# Patient Record
Sex: Female | Born: 1935 | Race: White | Hispanic: No | Marital: Married | Smoking: Former smoker
Health system: Southern US, Community
[De-identification: ages and names within clinical notes are randomized; demographics above are authoritative.]

## PROBLEM LIST (undated history)

## (undated) DIAGNOSIS — E039 Hypothyroidism, unspecified: Secondary | ICD-10-CM

## (undated) HISTORY — PX: THYROIDECTOMY: SHX17

## (undated) HISTORY — PX: CHOLECYSTECTOMY: SHX55

## (undated) HISTORY — PX: TONSILLECTOMY: SUR1361

---

## 2009-07-23 ENCOUNTER — Ambulatory Visit: Payer: Self-pay

## 2013-01-12 ENCOUNTER — Encounter (INDEPENDENT_AMBULATORY_CARE_PROVIDER_SITE_OTHER): Payer: Self-pay | Admitting: Ophthalmology

## 2013-02-08 ENCOUNTER — Encounter (INDEPENDENT_AMBULATORY_CARE_PROVIDER_SITE_OTHER): Payer: Self-pay | Admitting: Ophthalmology

## 2016-08-16 ENCOUNTER — Encounter: Payer: Self-pay | Admitting: Emergency Medicine

## 2016-08-16 ENCOUNTER — Emergency Department: Payer: Medicare Other

## 2016-08-16 ENCOUNTER — Observation Stay
Admission: EM | Admit: 2016-08-16 | Discharge: 2016-08-18 | Disposition: A | Discharge status: Home Health | Payer: Medicare Other | Attending: Internal Medicine | Admitting: Internal Medicine

## 2016-08-16 DIAGNOSIS — J90 Pleural effusion, not elsewhere classified: Secondary | ICD-10-CM | POA: Diagnosis not present

## 2016-08-16 DIAGNOSIS — E890 Postprocedural hypothyroidism: Secondary | ICD-10-CM | POA: Insufficient documentation

## 2016-08-16 DIAGNOSIS — Z7982 Long term (current) use of aspirin: Secondary | ICD-10-CM | POA: Insufficient documentation

## 2016-08-16 DIAGNOSIS — Z79899 Other long term (current) drug therapy: Secondary | ICD-10-CM | POA: Diagnosis not present

## 2016-08-16 DIAGNOSIS — Z681 Body mass index (BMI) 19 or less, adult: Secondary | ICD-10-CM | POA: Diagnosis not present

## 2016-08-16 DIAGNOSIS — J189 Pneumonia, unspecified organism: Secondary | ICD-10-CM

## 2016-08-16 DIAGNOSIS — Z87891 Personal history of nicotine dependence: Secondary | ICD-10-CM | POA: Diagnosis not present

## 2016-08-16 DIAGNOSIS — R0602 Shortness of breath: Secondary | ICD-10-CM

## 2016-08-16 DIAGNOSIS — R627 Adult failure to thrive: Secondary | ICD-10-CM | POA: Diagnosis not present

## 2016-08-16 DIAGNOSIS — D649 Anemia, unspecified: Secondary | ICD-10-CM | POA: Insufficient documentation

## 2016-08-16 DIAGNOSIS — R42 Dizziness and giddiness: Secondary | ICD-10-CM | POA: Diagnosis present

## 2016-08-16 DIAGNOSIS — E860 Dehydration: Secondary | ICD-10-CM | POA: Insufficient documentation

## 2016-08-16 DIAGNOSIS — D75839 Thrombocytosis, unspecified: Secondary | ICD-10-CM

## 2016-08-16 DIAGNOSIS — E43 Unspecified severe protein-calorie malnutrition: Secondary | ICD-10-CM | POA: Diagnosis not present

## 2016-08-16 DIAGNOSIS — Z9889 Other specified postprocedural states: Secondary | ICD-10-CM

## 2016-08-16 DIAGNOSIS — R262 Difficulty in walking, not elsewhere classified: Secondary | ICD-10-CM

## 2016-08-16 DIAGNOSIS — R531 Weakness: Secondary | ICD-10-CM

## 2016-08-16 DIAGNOSIS — E871 Hypo-osmolality and hyponatremia: Secondary | ICD-10-CM | POA: Diagnosis not present

## 2016-08-16 DIAGNOSIS — R739 Hyperglycemia, unspecified: Secondary | ICD-10-CM

## 2016-08-16 DIAGNOSIS — R918 Other nonspecific abnormal finding of lung field: Secondary | ICD-10-CM | POA: Diagnosis not present

## 2016-08-16 DIAGNOSIS — D473 Essential (hemorrhagic) thrombocythemia: Secondary | ICD-10-CM

## 2016-08-16 DIAGNOSIS — R7303 Prediabetes: Secondary | ICD-10-CM | POA: Diagnosis not present

## 2016-08-16 HISTORY — DX: Hypothyroidism, unspecified: E03.9

## 2016-08-16 LAB — URINALYSIS COMPLETE WITH MICROSCOPIC (ARMC ONLY)
BILIRUBIN URINE: NEGATIVE
Bacteria, UA: NONE SEEN
GLUCOSE, UA: NEGATIVE mg/dL
Hgb urine dipstick: NEGATIVE
NITRITE: NEGATIVE
PROTEIN: NEGATIVE mg/dL
Specific Gravity, Urine: 1.008 (ref 1.005–1.030)
pH: 7 (ref 5.0–8.0)

## 2016-08-16 LAB — COMPREHENSIVE METABOLIC PANEL
ALK PHOS: 139 U/L — AB (ref 38–126)
ALT: 11 U/L — AB (ref 14–54)
AST: 18 U/L (ref 15–41)
Albumin: 3.1 g/dL — ABNORMAL LOW (ref 3.5–5.0)
Anion gap: 7 (ref 5–15)
BILIRUBIN TOTAL: 0.8 mg/dL (ref 0.3–1.2)
BUN: 11 mg/dL (ref 6–20)
CHLORIDE: 97 mmol/L — AB (ref 101–111)
CO2: 29 mmol/L (ref 22–32)
CREATININE: 0.77 mg/dL (ref 0.44–1.00)
Calcium: 11.2 mg/dL — ABNORMAL HIGH (ref 8.9–10.3)
GFR calc Af Amer: >60 mL/min (ref >60)
Glucose, Bld: 123 mg/dL — ABNORMAL HIGH (ref 65–99)
Potassium: 3.5 mmol/L (ref 3.5–5.1)
Sodium: 133 mmol/L — ABNORMAL LOW (ref 135–145)
Total Protein: 8.2 g/dL — ABNORMAL HIGH (ref 6.5–8.1)

## 2016-08-16 LAB — GLUCOSE, CAPILLARY: GLUCOSE-CAPILLARY: 120 mg/dL — AB (ref 65–99)

## 2016-08-16 LAB — CBC
HCT: 35.3 % (ref 35.0–47.0)
Hemoglobin: 12 g/dL (ref 12.0–16.0)
MCH: 26.9 pg (ref 26.0–34.0)
MCHC: 33.9 g/dL (ref 32.0–36.0)
MCV: 79.4 fL — ABNORMAL LOW (ref 80.0–100.0)
PLATELETS: 570 10*3/uL — AB (ref 150–440)
RBC: 4.45 MIL/uL (ref 3.80–5.20)
RDW: 14.1 % (ref 11.5–14.5)
WBC: 10.6 10*3/uL (ref 3.6–11.0)

## 2016-08-16 MED ORDER — INSULIN ASPART 100 UNIT/ML ~~LOC~~ SOLN
0.0000 [IU] | Freq: Three times a day (TID) | SUBCUTANEOUS | Status: DC
  Administered 2016-08-17: 1 [IU] via SUBCUTANEOUS
  Filled 2016-08-16: qty 1

## 2016-08-16 MED ORDER — ONDANSETRON HCL 4 MG/2ML IJ SOLN
4.0000 mg | Freq: Four times a day (QID) | INTRAMUSCULAR | Status: DC | PRN
  Administered 2016-08-16 – 2016-08-17 (×3): 4 mg via INTRAVENOUS
  Filled 2016-08-16 (×2): qty 2

## 2016-08-16 MED ORDER — SODIUM CHLORIDE 0.9 % IV BOLUS (SEPSIS)
1000.0000 mL | Freq: Once | INTRAVENOUS | Status: AC
  Administered 2016-08-16: 1000 mL via INTRAVENOUS

## 2016-08-16 MED ORDER — LEVOFLOXACIN IN D5W 250 MG/50ML IV SOLN
250.0000 mg | INTRAVENOUS | Status: AC
  Administered 2016-08-17: 250 mg via INTRAVENOUS
  Filled 2016-08-16: qty 50

## 2016-08-16 MED ORDER — ACETAMINOPHEN 325 MG PO TABS: 650.0000 mg | ORAL_TABLET | Freq: Four times a day (QID) | ORAL | Status: DC | PRN

## 2016-08-16 MED ORDER — TRAMADOL HCL 50 MG PO TABS: 50.0000 mg | ORAL_TABLET | Freq: Four times a day (QID) | ORAL | Status: DC | PRN

## 2016-08-16 MED ORDER — SENNOSIDES-DOCUSATE SODIUM 8.6-50 MG PO TABS: 1.0000 | ORAL_TABLET | Freq: Every evening | ORAL | Status: DC | PRN

## 2016-08-16 MED ORDER — LEVOFLOXACIN IN D5W 500 MG/100ML IV SOLN
500.0000 mg | Freq: Once | INTRAVENOUS | Status: AC
  Administered 2016-08-16: 500 mg via INTRAVENOUS
  Filled 2016-08-16: qty 100

## 2016-08-16 MED ORDER — ONDANSETRON HCL 4 MG PO TABS: 4.0000 mg | ORAL_TABLET | Freq: Four times a day (QID) | ORAL | Status: DC | PRN

## 2016-08-16 MED ORDER — ACETAMINOPHEN 650 MG RE SUPP: 650.0000 mg | Freq: Four times a day (QID) | RECTAL | Status: DC | PRN

## 2016-08-16 MED ORDER — ONDANSETRON HCL 4 MG/2ML IJ SOLN
INTRAMUSCULAR | Status: AC
  Administered 2016-08-16: 4 mg via INTRAVENOUS
  Filled 2016-08-16: qty 2

## 2016-08-16 MED ORDER — INSULIN ASPART 100 UNIT/ML ~~LOC~~ SOLN: 0.0000 [IU] | Freq: Every day | SUBCUTANEOUS | Status: DC

## 2016-08-16 MED ORDER — IOPAMIDOL (ISOVUE-300) INJECTION 61%
75.0000 mL | Freq: Once | INTRAVENOUS | Status: AC | PRN
  Administered 2016-08-16: 75 mL via INTRAVENOUS

## 2016-08-16 MED ORDER — ONDANSETRON HCL 4 MG/2ML IJ SOLN: 4.0000 mg | Freq: Once | INTRAMUSCULAR | Status: DC

## 2016-08-16 MED ORDER — SODIUM CHLORIDE 0.9% FLUSH
3.0000 mL | Freq: Two times a day (BID) | INTRAVENOUS | Status: DC
  Administered 2016-08-16: 3 mL via INTRAVENOUS

## 2016-08-16 MED ORDER — SODIUM CHLORIDE 0.9 % IV SOLN
INTRAVENOUS | Status: AC
  Administered 2016-08-16 – 2016-08-17 (×2): via INTRAVENOUS

## 2016-08-16 NOTE — Care Management Obs Status (Signed)
[place] NOTIFICATION   Patient Details  Name: Gabriela Jordan MRN: [id number] Date of Birth: Jan 20, 1936   Medicare Observation Status Notification Given:  Yes    Ival Bible, RN 08/16/2016, 7:07 PM

## 2016-08-16 NOTE — ED Triage Notes (Signed)
Pt presents to ED via [facility] from home. Per EMS pt c/o N/V and dizziness when moving for approx 1 week. EMS states pt vomits when moving from a laying to a sitting position or a sitting to standing position. EMS denies vomiting when they move her but states patient hasn't had anything to eat and also c/o dizziness when she moves. Pt presents to ED alert and oriented at this time, states has never been diagnosed with vertigo.

## 2016-08-16 NOTE — Progress Notes (Signed)
Pharmacy Antibiotic Note  Gabriela Jordan is a 80 y.o. female admitted on 08/16/2016 with pneumonia.  Pharmacy has been consulted for Levaquin dosing. Patient received Levaquin '500mg'$  IV x one in the ED.  Plan: Levaquin '250mg'$  IV q24h  Height: '5\' 2"'$  (157.5 cm) Weight: 92 lb (41.7 kg) IBW/kg (Calculated) : 50.1  Temp (24hrs), Avg:98.4 F (36.9 C), Min:97.8 F (36.6 C), Max:98.9 F (37.2 C)   Recent Labs Lab 08/16/16 1423  WBC 10.6  CREATININE 0.77    Estimated Creatinine Clearance: 38.2 mL/min (by C-G formula based on SCr of 0.77 mg/dL).    No Known Allergies  Antimicrobials this admission: Levaquin 11/26 >>   Dose adjustments this admission:  Microbiology results:  Thank you for allowing pharmacy to be a part of this patient's care.  Paulina Fusi, PharmD, BCPS 08/16/2016 9:03 PM

## 2016-08-16 NOTE — ED Notes (Signed)
This RN notified of patient c/o nausea.

## 2016-08-16 NOTE — ED Notes (Signed)
Pt's daughter left, requesting to know when patient was to be transferred to the floor.

## 2016-08-16 NOTE — H&P (Signed)
[facility] NAME: Gabriela Jordan    MR#:  [id number]  DATE OF BIRTH:  1936/02/05  DATE OF ADMISSION:  08/16/2016  PRIMARY CARE PHYSICIAN: No primary care provider on file.   REQUESTING/REFERRING PHYSICIAN: Merlyn Lot, MD  CHIEF COMPLAINT:   dizziness HISTORY OF PRESENT ILLNESS:  Gabriela Jordan  is a 80 y.o. female with a known history of hypothyroidism , borderline DMIs presenting to the ED with a chief complaint of dizziness but denies any loss of consciousness. Patient is reporting significant weight loss of 10 pounds in the last 10 days. Patient is also reporting decreased appetite nausea and vomiting for a week. Patient had a CT head is normal, patient had CT chest and abdomen which has revealed 11 x 10 x 7 cm left lung mass and loculated pleural effusion. Hospitalist team is called to admit the patient for possible postobstructive pneumonia. Daughter came from [place] for works as a Therapist, sports to visit her mom for holidays and she is with her  PAST MEDICAL HISTORY:   Past Medical History:  Diagnosis Date  . Hypothyroid    Borderline DM PAST SURGICAL HISTOIRY:   Past Surgical History:  Procedure Laterality Date  . CHOLECYSTECTOMY    . THYROIDECTOMY    . TONSILLECTOMY      SOCIAL HISTORY:   Social History  Substance Use Topics  . Smoking status: Former Research scientist (life sciences)  . Smokeless tobacco: Never Used  . Alcohol use No    FAMILY HISTORY:  No family history on file.  DRUG ALLERGIES:  No Known Allergies  REVIEW OF SYSTEMS:  CONSTITUTIONAL: No fever, fatigue or weakness. Reporting 10 pounds weight loss in 10 days , decreased appetite EYES: No blurred or double vision.  EARS, NOSE, AND THROAT: No tinnitus or ear pain.  RESPIRATORY: No cough, shortness of breath, wheezing or hemoptysis.  CARDIOVASCULAR: No chest pain, orthopnea, edema.  GASTROINTESTINAL: No nausea, vomiting, diarrhea or abdominal pain.  GENITOURINARY: No  dysuria, hematuria.  ENDOCRINE: No polyuria, nocturia,  HEMATOLOGY: No anemia, easy bruising or bleeding SKIN: No rash or lesion. MUSCULOSKELETAL: No joint pain or arthritis.   NEUROLOGIC: Patient is reporting dizziness No tingling, numbness, weakness.  PSYCHIATRY: No anxiety or depression.   MEDICATIONS AT HOME:   Prior to Admission medications   Not on File      VITAL SIGNS:  Blood pressure (!) 152/77, pulse 96, temperature 98.9 F (37.2 C), temperature source Oral, resp. rate 18, height '5\' 2"'$  (1.575 m), weight 41.7 kg (92 lb), SpO2 92 %.  PHYSICAL EXAMINATION:  GENERAL:  80 y.o.-year-old patient lying in the bed with no acute distress.  EYES: Pupils equal, round, reactive to light and accommodation. No scleral icterus. Extraocular muscles intact.  HEENT: Head atraumatic, normocephalic. Oropharynx and nasopharynx clear.  NECK:  Supple, no jugular venous distention. No thyroid enlargement, no tenderness.  LUNGS: Diminished breath sounds in the left lung, moderate breath sounds in the right , no wheezing, rales,rhonchi or crepitation. No use of accessory muscles of respiration.  CARDIOVASCULAR: S1, S2 normal. No murmurs, rubs, or gallops.  ABDOMEN: Soft, nontender, nondistended. Bowel sounds present. No organomegaly or mass.  EXTREMITIES: No pedal edema, cyanosis, or clubbing.  NEUROLOGIC: Cranial nerves II through XII are intact. Muscle strength 5/5 in all extremities. Sensation intact. Gait not checked.  PSYCHIATRIC: The patient is alert and oriented x 3.  SKIN: No obvious rash, lesion, or ulcer.   LABORATORY PANEL:   CBC  Recent Labs Lab 08/16/16 1423  WBC 10.6  HGB 12.0  HCT 35.3  PLT 570*   ------------------------------------------------------------------------------------------------------------------  Chemistries   Recent Labs Lab 08/16/16 1423  NA 133*  K 3.5  CL 97*  CO2 29  GLUCOSE 123*  BUN 11  CREATININE 0.77  CALCIUM 11.2*  AST 18  ALT 11*   ALKPHOS 139*  BILITOT 0.8   ------------------------------------------------------------------------------------------------------------------  Cardiac Enzymes No results for input(s): TROPONINI in the last 168 hours. ------------------------------------------------------------------------------------------------------------------  RADIOLOGY:  Dg Chest 2 View  Result Date: 08/16/2016 CLINICAL DATA:  Nausea and vomiting EXAM: CHEST  2 VIEW COMPARISON:  None. FINDINGS: Cardiac shadow is obscured by a large left-sided pleural effusion. Additionally there is increased density projecting superiorly at in the left upper lobe likely related to consolidation in the superior segment of the left lower lobe. The right lung is clear with the exception of a few calcified granulomas. No bony abnormality is noted. IMPRESSION: Large left pleural effusion and apparent consolidation likely involving the superior segment of the left lower lobe. CT may be helpful for further evaluation. Electronically Signed   By: Inez Catalina M.D.   On: 08/16/2016 14:46   Ct Head Wo Contrast  Result Date: 08/16/2016 CLINICAL DATA:  Dizziness EXAM: CT HEAD WITHOUT CONTRAST TECHNIQUE: Contiguous axial images were obtained from the base of the skull through the vertex without intravenous contrast. COMPARISON:  None. FINDINGS: Brain: No evidence of acute infarction, hemorrhage, hydrocephalus, extra-axial collection or mass lesion/mass effect. Diffuse atrophic changes are noted. Vascular: No hyperdense vessel or unexpected calcification. Skull: Normal. Negative for fracture or focal lesion. Sinuses/Orbits: No acute finding. Other: None. IMPRESSION: Atrophic changes without acute abnormality. Electronically Signed   By: Inez Catalina M.D.   On: 08/16/2016 14:47   Ct Chest W Contrast  Result Date: 08/16/2016 CLINICAL DATA:  Possible metastatic disease EXAM: CT CHEST, ABDOMEN, AND PELVIS WITH CONTRAST TECHNIQUE: Multidetector CT  imaging of the chest, abdomen and pelvis was performed following the standard protocol during bolus administration of intravenous contrast. CONTRAST:  61m ISOVUE-300 IOPAMIDOL (ISOVUE-300) INJECTION 61% COMPARISON:  None. FINDINGS: CT CHEST FINDINGS Cardiovascular: Heart size within normal limits. No pericardial effusion. Central pulmonary artery is unremarkable. Atherosclerotic calcifications of thoracic aorta. Mediastinum/Nodes, lungs and pleura: There is a multilobulated mass in left hilum left upper lobe with central necrotic center. The mass measures at least 10.2 by 6.2 cm. The mass is best visualized in coronal image 42 extends about 11.4 cm cranial caudally by 7.3 cm transverse dimension. There is complete obstruction of left upper lobe bronchus. Obstruction of lingular bronchus. There is loculated large left pleural effusion probable malignant. There is significant heterogeneous expansile lung parenchyma in apex and superior segment of the left upper lobe. Findings highly suspicious for alveolar tumor extension. Entrapped lung parenchyma with proteinaceous or hemorrhagic alveolar products cannot be excluded as is expansile appearance of the major fissure. Similar appearance is noted just inferior to the tumor in lingula coronal image 38. There is partial atelectasis in left lower lobe. Findings highly suspicious for malignancy. Correlation with PET scan fluid analysis in pulmonology consult is recommended. No definite pleural thickening or pleural enhancement. Musculoskeletal: No destructive bony lesions are noted. Mild degenerative changes mid thoracic spine. Sagittal view of the sternum is unremarkable. No destructive rib lesions. CT ABDOMEN PELVIS FINDINGS Hepatobiliary: Enhanced liver shows mild intrahepatic biliary ductal dilatation. At least 2 cysts are noted in right hepatic lobe anteriorly the largest measures 8 mm. The patient is status  postcholecystectomy. CBD measures 6 mm in diameter. Pancreas:  Enhanced pancreas shows no focal mass or acute pancreatitis. Spleen: Enhanced spleen is normal. Adrenals/Urinary Tract: There is mild nodular thickening of right adrenal gland measures 8 mm. Mild thickening of left adrenal gland measures 9 mm. Metastatic disease cannot be excluded. Bilateral kidneys shows normal enhancement. No hydronephrosis or hydroureter. Delayed renal images shows bilateral renal symmetrical excretion. Stomach/Bowel: No small bowel obstruction. No pericecal inflammation. The terminal ileum is unremarkable. Normal appendix partially visualized in axial image 86. Colonic diverticula are noted sigmoid colon. There is redundant sigmoid colon. No evidence of acute diverticulitis. No evidence of acute colitis. Vascular/Lymphatic: Atherosclerotic calcifications of abdominal aorta and iliac arteries. No aortic aneurysm. No adenopathy. Reproductive: The uterus is not identified. No adnexal mass. There is mild thickening of vulvar wall. Clinical correlation is necessary. Other: No ascites or free abdominal air. The urinary bladder is under distended grossly unremarkable. Musculoskeletal: There are degenerative changes lumbar spine. Disc space flattening at L2-L3 level with vacuum disc phenomenon. Degenerative changes pubic symphysis. No destructive bony lesions are noted. There is dextroscoliosis of thoracic spine. IMPRESSION: 1. There is a dominant mass in left hilum left upper lobe extending in lingula. The mass measures at least 11.4 x 10.2 by 7.3 cm. There is complete obstruction of the bronchus in left upper lobe and lingula. There is low-density central necrotic center. This is highly suspicious for malignancy such as bronchogenic carcinoma. Further evaluation with PET scan fluid analysis and pulmonology consult is recommended. No definite evidence of pleural enhancement to suggest pleural metastatic disease. No significant mediastinal adenopathy 2. There is expansile heterogeneous appearance of  lung parenchyma in superior segment of left upper lobe and left apex. Findings suspicious for alveolar tumor spread or trapped lung with hemorrhagic or proteinaceous alveolar products. 3. There is loculated left pleural effusion. Partial atelectasis in left lower lobe. 4. The right lung is clear.  No pulmonary edema. 5. Mild bilateral nodular thickening of adrenal glands. Metastatic disease cannot be excluded. 6. No evidence of hepatic metastatic disease. Status postcholecystectomy. 7. No hydronephrosis or hydroureter. 8. No small bowel obstruction. 9. Colonic diverticula are noted left colon and sigmoid colon. No evidence of acute diverticulitis. 10. Degenerative changes lumbar spine. Dextroscoliosis of thoracic spine. 11. The uterus is not identified probable surgically absent. There is thickening of the wall of the vulva. Clinical correlation is necessary. Electronically Signed   By: Lahoma Crocker M.D.   On: 08/16/2016 17:24   Ct Abdomen Pelvis W Contrast  Result Date: 08/16/2016 CLINICAL DATA:  Possible metastatic disease EXAM: CT CHEST, ABDOMEN, AND PELVIS WITH CONTRAST TECHNIQUE: Multidetector CT imaging of the chest, abdomen and pelvis was performed following the standard protocol during bolus administration of intravenous contrast. CONTRAST:  38m ISOVUE-300 IOPAMIDOL (ISOVUE-300) INJECTION 61% COMPARISON:  None. FINDINGS: CT CHEST FINDINGS Cardiovascular: Heart size within normal limits. No pericardial effusion. Central pulmonary artery is unremarkable. Atherosclerotic calcifications of thoracic aorta. Mediastinum/Nodes, lungs and pleura: There is a multilobulated mass in left hilum left upper lobe with central necrotic center. The mass measures at least 10.2 by 6.2 cm. The mass is best visualized in coronal image 42 extends about 11.4 cm cranial caudally by 7.3 cm transverse dimension. There is complete obstruction of left upper lobe bronchus. Obstruction of lingular bronchus. There is loculated large  left pleural effusion probable malignant. There is significant heterogeneous expansile lung parenchyma in apex and superior segment of the left upper lobe. Findings highly suspicious for alveolar tumor  extension. Entrapped lung parenchyma with proteinaceous or hemorrhagic alveolar products cannot be excluded as is expansile appearance of the major fissure. Similar appearance is noted just inferior to the tumor in lingula coronal image 38. There is partial atelectasis in left lower lobe. Findings highly suspicious for malignancy. Correlation with PET scan fluid analysis in pulmonology consult is recommended. No definite pleural thickening or pleural enhancement. Musculoskeletal: No destructive bony lesions are noted. Mild degenerative changes mid thoracic spine. Sagittal view of the sternum is unremarkable. No destructive rib lesions. CT ABDOMEN PELVIS FINDINGS Hepatobiliary: Enhanced liver shows mild intrahepatic biliary ductal dilatation. At least 2 cysts are noted in right hepatic lobe anteriorly the largest measures 8 mm. The patient is status postcholecystectomy. CBD measures 6 mm in diameter. Pancreas: Enhanced pancreas shows no focal mass or acute pancreatitis. Spleen: Enhanced spleen is normal. Adrenals/Urinary Tract: There is mild nodular thickening of right adrenal gland measures 8 mm. Mild thickening of left adrenal gland measures 9 mm. Metastatic disease cannot be excluded. Bilateral kidneys shows normal enhancement. No hydronephrosis or hydroureter. Delayed renal images shows bilateral renal symmetrical excretion. Stomach/Bowel: No small bowel obstruction. No pericecal inflammation. The terminal ileum is unremarkable. Normal appendix partially visualized in axial image 86. Colonic diverticula are noted sigmoid colon. There is redundant sigmoid colon. No evidence of acute diverticulitis. No evidence of acute colitis. Vascular/Lymphatic: Atherosclerotic calcifications of abdominal aorta and iliac arteries.  No aortic aneurysm. No adenopathy. Reproductive: The uterus is not identified. No adnexal mass. There is mild thickening of vulvar wall. Clinical correlation is necessary. Other: No ascites or free abdominal air. The urinary bladder is under distended grossly unremarkable. Musculoskeletal: There are degenerative changes lumbar spine. Disc space flattening at L2-L3 level with vacuum disc phenomenon. Degenerative changes pubic symphysis. No destructive bony lesions are noted. There is dextroscoliosis of thoracic spine. IMPRESSION: 1. There is a dominant mass in left hilum left upper lobe extending in lingula. The mass measures at least 11.4 x 10.2 by 7.3 cm. There is complete obstruction of the bronchus in left upper lobe and lingula. There is low-density central necrotic center. This is highly suspicious for malignancy such as bronchogenic carcinoma. Further evaluation with PET scan fluid analysis and pulmonology consult is recommended. No definite evidence of pleural enhancement to suggest pleural metastatic disease. No significant mediastinal adenopathy 2. There is expansile heterogeneous appearance of lung parenchyma in superior segment of left upper lobe and left apex. Findings suspicious for alveolar tumor spread or trapped lung with hemorrhagic or proteinaceous alveolar products. 3. There is loculated left pleural effusion. Partial atelectasis in left lower lobe. 4. The right lung is clear.  No pulmonary edema. 5. Mild bilateral nodular thickening of adrenal glands. Metastatic disease cannot be excluded. 6. No evidence of hepatic metastatic disease. Status postcholecystectomy. 7. No hydronephrosis or hydroureter. 8. No small bowel obstruction. 9. Colonic diverticula are noted left colon and sigmoid colon. No evidence of acute diverticulitis. 10. Degenerative changes lumbar spine. Dextroscoliosis of thoracic spine. 11. The uterus is not identified probable surgically absent. There is thickening of the wall of  the vulva. Clinical correlation is necessary. Electronically Signed   By: Lahoma Crocker M.D.   On: 08/16/2016 17:24    EKG:   Orders placed or performed during the hospital encounter of 08/16/16  . ED EKG  . ED EKG  . EKG 12-Lead  . EKG 12-Lead    IMPRESSION AND PLAN:   Nayana Lenig  is a 80 y.o. female with a known history  of hypothyroidism , borderline DMIs presenting to the ED with a chief complaint of dizziness but denies any loss of consciousness. Patient is reporting significant weight loss of 10 pounds in the last 10 days. Patient is also reporting decreased appetite nausea and vomiting for a week. Patient had a CT chest and abdomen which has revealed 11 x 10 x 7 cm left lung mass and loculated pleural effusion  #Loculated pleural effusion with left lung mass with possible underlying postobstructive pneumonia Reporting intermittent episodes of shortness of breath Admit to MedSurg unit Consult interventional radiology for thoracentesis Holding DVT prophylaxis with Lovenox Consults pulmonology for bronchitis Consult is placed him oncology for lung mass Patient is started on levofloxacin  #UTI with abnormal urinalysis Patient is started on levofloxacin and urine culture is pending at this time  #Dizziness could be from decreased by mouth intake with decreased appetite Check orthostatics Monitor patient on telemetry Provide hydration with IV fluids PT evaluation  #Hyperthyroid and continue Synthroid and check TSH level  #7 history of borderline diet as well as to chemotherapy given A1c and start the patient on sliding scale insulin  DVT prophylaxis with SCDs, Start patient on Lovenox after the procedure   All the records are reviewed and case discussed with ED provider. Management plans discussed with the patient, daughter and son-in-law and they are in agreement.  CODE STATUS: Full code, daughter is the healthcare power of attorney  TOTAL TIME TAKING CARE OF THIS PATIENT:  43 minutes.   Note: This dictation was prepared with Dragon dictation along with smaller phrase technology. Any transcriptional errors that result from this process are unintentional.  Nicholes Mango M.D on 08/16/2016 at 6:07 PM  Between 7am to 6pm - Pager - [phone]  After 6pm go to [email or web address] - password [email or web address] [facility]  [facility]  Office  [phone]  CC: Primary care physician; No primary care provider on file.

## 2016-08-16 NOTE — ED Notes (Signed)
Report given to Ashley, RN

## 2016-08-16 NOTE — ED Provider Notes (Signed)
[facility] Emergency Department Provider Note    First MD Initiated Contact with Patient 08/16/16 1336     (approximate)  I have reviewed the triage vital signs and the nursing notes.   HISTORY  Chief Complaint Nausea and Near Syncope    HPI Gabriela Jordan is a 80 y.o. female  who presents from home via EMS due to complaint of nausea vomitus and lightheadedness over the past one week. Patient states that she gets very lightheaded when sitting up from laying position or standing from a sitting position. Patient states that her appetite has decreased significantly over the past 2 weeks. She admits to significant weight loss. Denies any fevers. No chest pain. No numbness or tingling. Denies any headache.   Past Medical History:  Diagnosis Date  . Hypothyroid    No family history on file. Past Surgical History:  Procedure Laterality Date  . CHOLECYSTECTOMY    . THYROIDECTOMY    . TONSILLECTOMY     There are no active problems to display for this patient.     Prior to Admission medications   Not on File    Allergies Patient has no known allergies.    Social History Social History  Substance Use Topics  . Smoking status: Former Research scientist (life sciences)  . Smokeless tobacco: Never Used  . Alcohol use No    Review of Systems Patient denies headaches, rhinorrhea, blurry vision, numbness, shortness of breath, chest pain, edema, cough, abdominal pain, nausea, vomiting, diarrhea, dysuria, fevers, rashes or hallucinations unless otherwise stated above in HPI. ____________________________________________   PHYSICAL EXAM:  VITAL SIGNS: Vitals:   08/16/16 1350  BP: (!) 152/77  Pulse: 96  Resp: 18  Temp: 98.9 F (37.2 C)    Constitutional: Alert and oriented. Well appearing and in no acute distress. Eyes: Conjunctivae are normal. PERRL. EOMI. Head: Atraumatic. Nose: No congestion/rhinnorhea. Mouth/Throat: Mucous membranes are moist.  Oropharynx  non-erythematous. Neck: No stridor. Painless ROM. No cervical spine tenderness to palpation Hematological/Lymphatic/Immunilogical: No cervical lymphadenopathy. Cardiovascular: Normal rate, regular rhythm. Grossly normal heart sounds.  Good peripheral circulation. Respiratory: Normal respiratory effort.  No retractions. Lungs CTAB. Gastrointestinal: Soft and nontender. No distention. No abdominal bruits. No CVA tenderness. Genitourinary:  Musculoskeletal: No lower extremity tenderness nor edema.  No joint effusions. Neurologic:  Normal speech and language. No gross focal neurologic deficits are appreciated. No gait instability. Skin:  Skin is warm, dry and intact. No rash noted.   ____________________________________________   LABS (all labs ordered are listed, but only abnormal results are displayed)  Results for orders placed or performed during the hospital encounter of 08/16/16 (from the past 24 hour(s))  CBC     Status: Abnormal   Collection Time: 08/16/16  2:23 PM  Result Value Ref Range   WBC 10.6 3.6 - 11.0 K/uL   RBC 4.45 3.80 - 5.20 MIL/uL   Hemoglobin 12.0 12.0 - 16.0 g/dL   HCT 35.3 35.0 - 47.0 %   MCV 79.4 (L) 80.0 - 100.0 fL   MCH 26.9 26.0 - 34.0 pg   MCHC 33.9 32.0 - 36.0 g/dL   RDW 14.1 11.5 - 14.5 %   Platelets 570 (H) 150 - 440 K/uL  Comprehensive metabolic panel     Status: Abnormal   Collection Time: 08/16/16  2:23 PM  Result Value Ref Range   Sodium 133 (L) 135 - 145 mmol/L   Potassium 3.5 3.5 - 5.1 mmol/L   Chloride 97 (L) 101 - 111 mmol/L  CO2 29 22 - 32 mmol/L   Glucose, Bld 123 (H) 65 - 99 mg/dL   BUN 11 6 - 20 mg/dL   Creatinine, Ser 0.77 0.44 - 1.00 mg/dL   Calcium 11.2 (H) 8.9 - 10.3 mg/dL   Total Protein 8.2 (H) 6.5 - 8.1 g/dL   Albumin 3.1 (L) 3.5 - 5.0 g/dL   AST 18 15 - 41 U/L   ALT 11 (L) 14 - 54 U/L   Alkaline Phosphatase 139 (H) 38 - 126 U/L   Total Bilirubin 0.8 0.3 - 1.2 mg/dL   GFR calc non Af Amer >60 >60 mL/min   GFR calc Af  Amer >60 >60 mL/min   Anion gap 7 5 - 15  Urinalysis complete, with microscopic     Status: Abnormal   Collection Time: 08/16/16  4:12 PM  Result Value Ref Range   Color, Urine YELLOW (A) YELLOW   APPearance CLOUDY (A) CLEAR   Glucose, UA NEGATIVE NEGATIVE mg/dL   Bilirubin Urine NEGATIVE NEGATIVE   Ketones, ur TRACE (A) NEGATIVE mg/dL   Specific Gravity, Urine 1.008 1.005 - 1.030   Hgb urine dipstick NEGATIVE NEGATIVE   pH 7.0 5.0 - 8.0   Protein, ur NEGATIVE NEGATIVE mg/dL   Nitrite NEGATIVE NEGATIVE   Leukocytes, UA 3+ (A) NEGATIVE   RBC / HPF 0-5 0 - 5 RBC/hpf   WBC, UA 6-30 0 - 5 WBC/hpf   Bacteria, UA NONE SEEN NONE SEEN   Squamous Epithelial / LPF 0-5 (A) NONE SEEN   Mucous PRESENT    Amorphous Crystal PRESENT    ____________________________________________  EKG My review and personal interpretation at Time: 14:13   Indication: dizziness  Rate: 90  Rhythm: sinus Axis: normal Other: non specific st changes, normal intervals., ____________________________________________  RADIOLOGY  I personally reviewed all radiographic images ordered to evaluate for the above acute complaints and reviewed radiology reports and findings.  These findings were personally discussed with the patient.  Please see medical record for radiology report.  ____________________________________________   PROCEDURES  Procedure(s) performed: none Procedures    Critical Care performed: no ____________________________________________   INITIAL IMPRESSION / ASSESSMENT AND PLAN / ED COURSE  Pertinent labs & imaging results that were available during my care of the patient were reviewed by me and considered in my medical decision making (see chart for details).  DDX: dehydration, electrolyte ab, uti, pna, chf, malignancy  Gabriela Jordan is a 80 y.o. who presents to the ED with Complaint of nausea vomiting dizziness over the past 1 week. Patient appears markedly dehydrated and cachectic. My  initial exam is more concerning for severe protein calorie malnutrition likely secondary to probable underlying malignancy. No evidence of sepsis. No evidence of neuro deficit. CT imaging will be ordered to evaluate for malignancy versus CVA. Her exam is less consistent with BPPV. We'll provide IV fluids for dehydration. Will order laboratory evaluation due to concern for I light abnormality.  The patient will be placed on continuous pulse oximetry and telemetry for monitoring.  Laboratory evaluation will be sent to evaluate for the above complaints.     Clinical Course as of Aug 17 1707  Nancy Fetter Aug 16, 2016  1704 Had extensive conversation with patient and family at bedside regarding her goals of care. Currently wishes to be full code. Spoke with interventional radiology in [place] regarding appropriate workup and timing this patient will need both bronchoscopy and diagnostic paracentesis.  He recommended the patient be admitted to the hospital  for coordinated thoracentesis and bronchoscopy.  I spoke with Dr. Margaretmary Eddy regarding admission for probable post obstructive pna.  Have discussed with the patient and available family all diagnostics and treatments performed thus far and all questions were answered to the best of my ability. The patient demonstrates understanding and agreement with plan.   [PR]    Clinical Course User Index [PR] Merlyn Lot, MD     ____________________________________________   FINAL CLINICAL IMPRESSION(S) / ED DIAGNOSES  Final diagnoses:  Dizziness  Weakness  Pleural effusion  Pneumonia of left lung due to infectious organism, unspecified part of lung  Dehydration      NEW MEDICATIONS STARTED DURING THIS VISIT:  New Prescriptions   No medications on file     Note:  This document was prepared using Dragon voice recognition software and may include unintentional dictation errors.    Merlyn Lot, MD 08/16/16 [phone]

## 2016-08-16 NOTE — ED Notes (Signed)
Pt assisted to the bathroom by this RN. Pt then ambulated down the hall. Pt tolerated well. Pt O2 sats 95-100% at this time.

## 2016-08-16 NOTE — ED Notes (Addendum)
Pt states she feels better after receiving the Zofran. Pt taken to the floor by Lorriane Shire, RN.

## 2016-08-17 ENCOUNTER — Observation Stay
Admit: 2016-08-17 | Discharge: 2016-08-17 | Disposition: A | Discharge status: Home / Self Care | Payer: Medicare Other | Attending: Internal Medicine | Admitting: Internal Medicine

## 2016-08-17 ENCOUNTER — Observation Stay: Payer: Medicare Other

## 2016-08-17 DIAGNOSIS — R42 Dizziness and giddiness: Secondary | ICD-10-CM

## 2016-08-17 DIAGNOSIS — R918 Other nonspecific abnormal finding of lung field: Secondary | ICD-10-CM | POA: Diagnosis not present

## 2016-08-17 DIAGNOSIS — R112 Nausea with vomiting, unspecified: Secondary | ICD-10-CM

## 2016-08-17 DIAGNOSIS — E039 Hypothyroidism, unspecified: Secondary | ICD-10-CM

## 2016-08-17 DIAGNOSIS — R634 Abnormal weight loss: Secondary | ICD-10-CM

## 2016-08-17 DIAGNOSIS — Z79899 Other long term (current) drug therapy: Secondary | ICD-10-CM

## 2016-08-17 DIAGNOSIS — Z87891 Personal history of nicotine dependence: Secondary | ICD-10-CM

## 2016-08-17 LAB — COMPREHENSIVE METABOLIC PANEL
ALBUMIN: 2.7 g/dL — AB (ref 3.5–5.0)
ALT: 9 U/L — AB (ref 14–54)
AST: 12 U/L — AB (ref 15–41)
Alkaline Phosphatase: 109 U/L (ref 38–126)
Anion gap: 5 (ref 5–15)
BUN: 10 mg/dL (ref 6–20)
CHLORIDE: 105 mmol/L (ref 101–111)
CO2: 27 mmol/L (ref 22–32)
CREATININE: 0.66 mg/dL (ref 0.44–1.00)
Calcium: 10.4 mg/dL — ABNORMAL HIGH (ref 8.9–10.3)
GFR calc Af Amer: >60 mL/min (ref >60)
Glucose, Bld: 92 mg/dL (ref 65–99)
POTASSIUM: 3.5 mmol/L (ref 3.5–5.1)
SODIUM: 137 mmol/L (ref 135–145)
Total Bilirubin: 0.7 mg/dL (ref 0.3–1.2)
Total Protein: 6.9 g/dL (ref 6.5–8.1)

## 2016-08-17 LAB — CBC
HEMATOCRIT: 31.2 % — AB (ref 35.0–47.0)
Hemoglobin: 10.5 g/dL — ABNORMAL LOW (ref 12.0–16.0)
MCH: 26.3 pg (ref 26.0–34.0)
MCHC: 33.6 g/dL (ref 32.0–36.0)
MCV: 78.5 fL — AB (ref 80.0–100.0)
Platelets: 481 10*3/uL — ABNORMAL HIGH (ref 150–440)
RBC: 3.97 MIL/uL (ref 3.80–5.20)
RDW: 14.3 % (ref 11.5–14.5)
WBC: 8.3 10*3/uL (ref 3.6–11.0)

## 2016-08-17 LAB — GLUCOSE, CAPILLARY
GLUCOSE-CAPILLARY: 121 mg/dL — AB (ref 65–99)
Glucose-Capillary: 96 mg/dL (ref 65–99)
Glucose-Capillary: 108 mg/dL — ABNORMAL HIGH (ref 65–99)
Glucose-Capillary: 139 mg/dL — ABNORMAL HIGH (ref 65–99)

## 2016-08-17 LAB — BODY FLUID CELL COUNT WITH DIFFERENTIAL
EOS FL: 0 %
Lymphs, Fluid: 85 %
MONOCYTE-MACROPHAGE-SEROUS FLUID: 5 %
NEUTROPHIL FLUID: 10 %
OTHER CELLS FL: 0 %
Total Nucleated Cell Count, Fluid: 3150 cu mm

## 2016-08-17 LAB — PROTIME-INR
INR: 1.12
Prothrombin Time: 14.5 seconds (ref 11.4–15.2)

## 2016-08-17 LAB — PROTEIN, BODY FLUID: Total protein, fluid: 4.9 g/dL

## 2016-08-17 LAB — TSH: TSH: 1.217 u[IU]/mL (ref 0.350–4.500)

## 2016-08-17 LAB — LACTATE DEHYDROGENASE, PLEURAL OR PERITONEAL FLUID: LD FL: 180 U/L — AB (ref 3–23)

## 2016-08-17 LAB — GLUCOSE, SEROUS FLUID: Glucose, Fluid: 114 mg/dL

## 2016-08-17 MED ORDER — LEVOFLOXACIN IN D5W 750 MG/150ML IV SOLN
750.0000 mg | INTRAVENOUS | Status: DC
  Filled 2016-08-17: qty 150

## 2016-08-17 MED ORDER — PANTOPRAZOLE SODIUM 40 MG PO TBEC
40.0000 mg | DELAYED_RELEASE_TABLET | Freq: Two times a day (BID) | ORAL | Status: DC
  Administered 2016-08-17 – 2016-08-18 (×3): 40 mg via ORAL
  Filled 2016-08-17 (×3): qty 1

## 2016-08-17 MED ORDER — GLUCERNA SHAKE PO LIQD: 237.0000 mL | Freq: Three times a day (TID) | ORAL | Status: DC

## 2016-08-17 MED ORDER — ENSURE ENLIVE PO LIQD
237.0000 mL | Freq: Three times a day (TID) | ORAL | Status: DC
  Administered 2016-08-17 – 2016-08-18 (×3): 237 mL via ORAL

## 2016-08-17 MED ORDER — ASPIRIN EC 81 MG PO TBEC
81.0000 mg | DELAYED_RELEASE_TABLET | Freq: Every day | ORAL | Status: DC
  Administered 2016-08-18: 81 mg via ORAL
  Filled 2016-08-17: qty 1

## 2016-08-17 MED ORDER — SODIUM CHLORIDE 0.9 % IV SOLN
INTRAVENOUS | Status: DC
  Administered 2016-08-17 – 2016-08-18 (×2): via INTRAVENOUS

## 2016-08-17 MED ORDER — BOOST / RESOURCE BREEZE PO LIQD
1.0000 | Freq: Three times a day (TID) | ORAL | Status: DC
  Administered 2016-08-17: 1 via ORAL

## 2016-08-17 MED ORDER — ENOXAPARIN SODIUM 30 MG/0.3ML ~~LOC~~ SOLN
30.0000 mg | SUBCUTANEOUS | Status: DC
  Administered 2016-08-17: 30 mg via SUBCUTANEOUS
  Filled 2016-08-17: qty 0.3

## 2016-08-17 MED ORDER — MIRTAZAPINE 15 MG PO TABS
15.0000 mg | ORAL_TABLET | Freq: Every day | ORAL | Status: DC
  Administered 2016-08-17: 15 mg via ORAL
  Filled 2016-08-17: qty 1

## 2016-08-17 MED ORDER — LEVOTHYROXINE SODIUM 50 MCG PO TABS
50.0000 ug | ORAL_TABLET | Freq: Every day | ORAL | Status: DC
  Administered 2016-08-18: 50 ug via ORAL
  Filled 2016-08-17: qty 1

## 2016-08-17 MED ORDER — LORAZEPAM 2 MG/ML IJ SOLN: 0.5000 mg | Freq: Once | INTRAMUSCULAR | Status: DC

## 2016-08-17 MED ORDER — CALCITONIN (SALMON) 200 UNIT/ACT NA SOLN
1.0000 | Freq: Every day | NASAL | Status: DC
  Administered 2016-08-18: 1 via NASAL
  Filled 2016-08-17: qty 3.7

## 2016-08-17 NOTE — Progress Notes (Signed)
CT chest reviewed: large left lung mass with collapse with large left effusion I recommend US guided Thoracentesis for therapeutic and diagnostic assessment Cytology pending  Case with Discussed with Dr. Ponciano Ort, M.D.  [facility]  Medical Director [place] Director [facility] Cardio-Pulmonary Department

## 2016-08-17 NOTE — Progress Notes (Signed)
[facility] NAME: Gabriela Jordan    MR#:  [id number]  DATE OF BIRTH:  1936-03-20  SUBJECTIVE:  CHIEF COMPLAINT:   Chief Complaint  Patient presents with  . Nausea  . Near Syncope   The patient is a 80 year old female with medical history significant for history of hypothyroidism, borderline diabetes mellitus, who presents to the hospital with complaints of dizziness, weight loss of approximately 10 pounds over the past 10 days, decreased appetite, nausea and vomiting. Patient had CT of chest and abdomen done which revealed left lung mass and loculated pleural effusion. Patient was admitted for evaluation and treatment. Consultation with oncologist and pulmonologist was requested, pending. Thoracentesis was ordered for  Today, results are pending. Discussed with Dr. Mortimer Fries, pulmonologist, who recommended thoracentesis., But not bronchoscopy. The patient complains of some epigastric abdominal pain on palpation    Review of Systems  Constitutional: Positive for weight loss. Negative for chills and fever.  HENT: Negative for congestion.   Eyes: Negative for blurred vision and double vision.  Respiratory: Negative for cough, sputum production, shortness of breath and wheezing.   Cardiovascular: Negative for chest pain, palpitations, orthopnea, leg swelling and PND.  Gastrointestinal: Positive for abdominal pain and nausea. Negative for blood in stool, constipation, diarrhea and vomiting.  Genitourinary: Negative for dysuria, frequency, hematuria and urgency.  Musculoskeletal: Negative for falls.  Neurological: Negative for dizziness, tremors, focal weakness and headaches.  Endo/Heme/Allergies: Does not bruise/bleed easily.  Psychiatric/Behavioral: Negative for depression. The patient does not have insomnia.     VITAL SIGNS: Blood pressure 140/75, pulse 90, temperature 98 F (36.7 C), temperature source Oral, resp. rate 17, height '5\' 2"'$   (1.575 m), weight 41.7 kg (92 lb), SpO2 92 %.  PHYSICAL EXAMINATION:   GENERAL:  80 y.o.-year-old patient lying in the bed with no acute distress.  EYES: Pupils equal, round, reactive to light and accommodation. No scleral icterus. Extraocular muscles intact.  HEENT: Head atraumatic, normocephalic. Oropharynx and nasopharynx clear.  NECK:  Supple, no jugular venous distention. No thyroid enlargement, no tenderness.  LUNGS: Normal breath sounds bilaterally, no wheezing, rales,rhonchi or crepitation. No use of accessory muscles of respiration.  CARDIOVASCULAR: S1, S2 normal. No murmurs, rubs, or gallops.  ABDOMEN: Soft, tenderness in epigastric abdominal area, but no rebound or guarding, nondistended. Bowel sounds present. No organomegaly or mass.  EXTREMITIES: No pedal edema, cyanosis, or clubbing.  NEUROLOGIC: Cranial nerves II through XII are intact. Muscle strength 5/5 in all extremities. Sensation intact. Gait not checked.  PSYCHIATRIC: The patient is alert and oriented x 3.  SKIN: No obvious rash, lesion, or ulcer.   ORDERS/RESULTS REVIEWED:   CBC  Recent Labs Lab 08/16/16 1423 08/17/16 0516  WBC 10.6 8.3  HGB 12.0 10.5*  HCT 35.3 31.2*  PLT 570* 481*  MCV 79.4* 78.5*  MCH 26.9 26.3  MCHC 33.9 33.6  RDW 14.1 14.3   ------------------------------------------------------------------------------------------------------------------  Chemistries   Recent Labs Lab 08/16/16 1423 08/17/16 0516  NA 133* 137  K 3.5 3.5  CL 97* 105  CO2 29 27  GLUCOSE 123* 92  BUN 11 10  CREATININE 0.77 0.66  CALCIUM 11.2* 10.4*  AST 18 12*  ALT 11* 9*  ALKPHOS 139* 109  BILITOT 0.8 0.7   ------------------------------------------------------------------------------------------------------------------ estimated creatinine clearance is 38.2 mL/min (by C-G formula based on SCr of 0.66  mg/dL). ------------------------------------------------------------------------------------------------------------------  Recent Labs  08/17/16 0516  TSH 1.217    Cardiac Enzymes No results  for input(s): CKMB, TROPONINI, MYOGLOBIN in the last 168 hours.  Invalid input(s): CK ------------------------------------------------------------------------------------------------------------------ Invalid input(s): POCBNP ---------------------------------------------------------------------------------------------------------------  RADIOLOGY: Dg Chest 2 View  Result Date: 08/16/2016 CLINICAL DATA:  Nausea and vomiting EXAM: CHEST  2 VIEW COMPARISON:  None. FINDINGS: Cardiac shadow is obscured by a large left-sided pleural effusion. Additionally there is increased density projecting superiorly at in the left upper lobe likely related to consolidation in the superior segment of the left lower lobe. The right lung is clear with the exception of a few calcified granulomas. No bony abnormality is noted. IMPRESSION: Large left pleural effusion and apparent consolidation likely involving the superior segment of the left lower lobe. CT may be helpful for further evaluation. Electronically Signed   By: Inez Catalina M.D.   On: 08/16/2016 14:46   Ct Head Wo Contrast  Result Date: 08/16/2016 CLINICAL DATA:  Dizziness EXAM: CT HEAD WITHOUT CONTRAST TECHNIQUE: Contiguous axial images were obtained from the base of the skull through the vertex without intravenous contrast. COMPARISON:  None. FINDINGS: Brain: No evidence of acute infarction, hemorrhage, hydrocephalus, extra-axial collection or mass lesion/mass effect. Diffuse atrophic changes are noted. Vascular: No hyperdense vessel or unexpected calcification. Skull: Normal. Negative for fracture or focal lesion. Sinuses/Orbits: No acute finding. Other: None. IMPRESSION: Atrophic changes without acute abnormality. Electronically Signed   By: Inez Catalina M.D.    On: 08/16/2016 14:47   Ct Chest W Contrast  Result Date: 08/16/2016 CLINICAL DATA:  Possible metastatic disease EXAM: CT CHEST, ABDOMEN, AND PELVIS WITH CONTRAST TECHNIQUE: Multidetector CT imaging of the chest, abdomen and pelvis was performed following the standard protocol during bolus administration of intravenous contrast. CONTRAST:  48m ISOVUE-300 IOPAMIDOL (ISOVUE-300) INJECTION 61% COMPARISON:  None. FINDINGS: CT CHEST FINDINGS Cardiovascular: Heart size within normal limits. No pericardial effusion. Central pulmonary artery is unremarkable. Atherosclerotic calcifications of thoracic aorta. Mediastinum/Nodes, lungs and pleura: There is a multilobulated mass in left hilum left upper lobe with central necrotic center. The mass measures at least 10.2 by 6.2 cm. The mass is best visualized in coronal image 42 extends about 11.4 cm cranial caudally by 7.3 cm transverse dimension. There is complete obstruction of left upper lobe bronchus. Obstruction of lingular bronchus. There is loculated large left pleural effusion probable malignant. There is significant heterogeneous expansile lung parenchyma in apex and superior segment of the left upper lobe. Findings highly suspicious for alveolar tumor extension. Entrapped lung parenchyma with proteinaceous or hemorrhagic alveolar products cannot be excluded as is expansile appearance of the major fissure. Similar appearance is noted just inferior to the tumor in lingula coronal image 38. There is partial atelectasis in left lower lobe. Findings highly suspicious for malignancy. Correlation with PET scan fluid analysis in pulmonology consult is recommended. No definite pleural thickening or pleural enhancement. Musculoskeletal: No destructive bony lesions are noted. Mild degenerative changes mid thoracic spine. Sagittal view of the sternum is unremarkable. No destructive rib lesions. CT ABDOMEN PELVIS FINDINGS Hepatobiliary: Enhanced liver shows mild intrahepatic  biliary ductal dilatation. At least 2 cysts are noted in right hepatic lobe anteriorly the largest measures 8 mm. The patient is status postcholecystectomy. CBD measures 6 mm in diameter. Pancreas: Enhanced pancreas shows no focal mass or acute pancreatitis. Spleen: Enhanced spleen is normal. Adrenals/Urinary Tract: There is mild nodular thickening of right adrenal gland measures 8 mm. Mild thickening of left adrenal gland measures 9 mm. Metastatic disease cannot be excluded. Bilateral kidneys shows normal enhancement. No hydronephrosis or hydroureter. Delayed renal images shows bilateral  renal symmetrical excretion. Stomach/Bowel: No small bowel obstruction. No pericecal inflammation. The terminal ileum is unremarkable. Normal appendix partially visualized in axial image 86. Colonic diverticula are noted sigmoid colon. There is redundant sigmoid colon. No evidence of acute diverticulitis. No evidence of acute colitis. Vascular/Lymphatic: Atherosclerotic calcifications of abdominal aorta and iliac arteries. No aortic aneurysm. No adenopathy. Reproductive: The uterus is not identified. No adnexal mass. There is mild thickening of vulvar wall. Clinical correlation is necessary. Other: No ascites or free abdominal air. The urinary bladder is under distended grossly unremarkable. Musculoskeletal: There are degenerative changes lumbar spine. Disc space flattening at L2-L3 level with vacuum disc phenomenon. Degenerative changes pubic symphysis. No destructive bony lesions are noted. There is dextroscoliosis of thoracic spine. IMPRESSION: 1. There is a dominant mass in left hilum left upper lobe extending in lingula. The mass measures at least 11.4 x 10.2 by 7.3 cm. There is complete obstruction of the bronchus in left upper lobe and lingula. There is low-density central necrotic center. This is highly suspicious for malignancy such as bronchogenic carcinoma. Further evaluation with PET scan fluid analysis and pulmonology  consult is recommended. No definite evidence of pleural enhancement to suggest pleural metastatic disease. No significant mediastinal adenopathy 2. There is expansile heterogeneous appearance of lung parenchyma in superior segment of left upper lobe and left apex. Findings suspicious for alveolar tumor spread or trapped lung with hemorrhagic or proteinaceous alveolar products. 3. There is loculated left pleural effusion. Partial atelectasis in left lower lobe. 4. The right lung is clear.  No pulmonary edema. 5. Mild bilateral nodular thickening of adrenal glands. Metastatic disease cannot be excluded. 6. No evidence of hepatic metastatic disease. Status postcholecystectomy. 7. No hydronephrosis or hydroureter. 8. No small bowel obstruction. 9. Colonic diverticula are noted left colon and sigmoid colon. No evidence of acute diverticulitis. 10. Degenerative changes lumbar spine. Dextroscoliosis of thoracic spine. 11. The uterus is not identified probable surgically absent. There is thickening of the wall of the vulva. Clinical correlation is necessary. Electronically Signed   By: Lahoma Crocker M.D.   On: 08/16/2016 17:24   Ct Abdomen Pelvis W Contrast  Result Date: 08/16/2016 CLINICAL DATA:  Possible metastatic disease EXAM: CT CHEST, ABDOMEN, AND PELVIS WITH CONTRAST TECHNIQUE: Multidetector CT imaging of the chest, abdomen and pelvis was performed following the standard protocol during bolus administration of intravenous contrast. CONTRAST:  52m ISOVUE-300 IOPAMIDOL (ISOVUE-300) INJECTION 61% COMPARISON:  None. FINDINGS: CT CHEST FINDINGS Cardiovascular: Heart size within normal limits. No pericardial effusion. Central pulmonary artery is unremarkable. Atherosclerotic calcifications of thoracic aorta. Mediastinum/Nodes, lungs and pleura: There is a multilobulated mass in left hilum left upper lobe with central necrotic center. The mass measures at least 10.2 by 6.2 cm. The mass is best visualized in coronal image  42 extends about 11.4 cm cranial caudally by 7.3 cm transverse dimension. There is complete obstruction of left upper lobe bronchus. Obstruction of lingular bronchus. There is loculated large left pleural effusion probable malignant. There is significant heterogeneous expansile lung parenchyma in apex and superior segment of the left upper lobe. Findings highly suspicious for alveolar tumor extension. Entrapped lung parenchyma with proteinaceous or hemorrhagic alveolar products cannot be excluded as is expansile appearance of the major fissure. Similar appearance is noted just inferior to the tumor in lingula coronal image 38. There is partial atelectasis in left lower lobe. Findings highly suspicious for malignancy. Correlation with PET scan fluid analysis in pulmonology consult is recommended. No definite pleural thickening or pleural  enhancement. Musculoskeletal: No destructive bony lesions are noted. Mild degenerative changes mid thoracic spine. Sagittal view of the sternum is unremarkable. No destructive rib lesions. CT ABDOMEN PELVIS FINDINGS Hepatobiliary: Enhanced liver shows mild intrahepatic biliary ductal dilatation. At least 2 cysts are noted in right hepatic lobe anteriorly the largest measures 8 mm. The patient is status postcholecystectomy. CBD measures 6 mm in diameter. Pancreas: Enhanced pancreas shows no focal mass or acute pancreatitis. Spleen: Enhanced spleen is normal. Adrenals/Urinary Tract: There is mild nodular thickening of right adrenal gland measures 8 mm. Mild thickening of left adrenal gland measures 9 mm. Metastatic disease cannot be excluded. Bilateral kidneys shows normal enhancement. No hydronephrosis or hydroureter. Delayed renal images shows bilateral renal symmetrical excretion. Stomach/Bowel: No small bowel obstruction. No pericecal inflammation. The terminal ileum is unremarkable. Normal appendix partially visualized in axial image 86. Colonic diverticula are noted sigmoid  colon. There is redundant sigmoid colon. No evidence of acute diverticulitis. No evidence of acute colitis. Vascular/Lymphatic: Atherosclerotic calcifications of abdominal aorta and iliac arteries. No aortic aneurysm. No adenopathy. Reproductive: The uterus is not identified. No adnexal mass. There is mild thickening of vulvar wall. Clinical correlation is necessary. Other: No ascites or free abdominal air. The urinary bladder is under distended grossly unremarkable. Musculoskeletal: There are degenerative changes lumbar spine. Disc space flattening at L2-L3 level with vacuum disc phenomenon. Degenerative changes pubic symphysis. No destructive bony lesions are noted. There is dextroscoliosis of thoracic spine. IMPRESSION: 1. There is a dominant mass in left hilum left upper lobe extending in lingula. The mass measures at least 11.4 x 10.2 by 7.3 cm. There is complete obstruction of the bronchus in left upper lobe and lingula. There is low-density central necrotic center. This is highly suspicious for malignancy such as bronchogenic carcinoma. Further evaluation with PET scan fluid analysis and pulmonology consult is recommended. No definite evidence of pleural enhancement to suggest pleural metastatic disease. No significant mediastinal adenopathy 2. There is expansile heterogeneous appearance of lung parenchyma in superior segment of left upper lobe and left apex. Findings suspicious for alveolar tumor spread or trapped lung with hemorrhagic or proteinaceous alveolar products. 3. There is loculated left pleural effusion. Partial atelectasis in left lower lobe. 4. The right lung is clear.  No pulmonary edema. 5. Mild bilateral nodular thickening of adrenal glands. Metastatic disease cannot be excluded. 6. No evidence of hepatic metastatic disease. Status postcholecystectomy. 7. No hydronephrosis or hydroureter. 8. No small bowel obstruction. 9. Colonic diverticula are noted left colon and sigmoid colon. No evidence  of acute diverticulitis. 10. Degenerative changes lumbar spine. Dextroscoliosis of thoracic spine. 11. The uterus is not identified probable surgically absent. There is thickening of the wall of the vulva. Clinical correlation is necessary. Electronically Signed   By: Lahoma Crocker M.D.   On: 08/16/2016 17:24    EKG:  Orders placed or performed during the hospital encounter of 08/16/16  . ED EKG  . ED EKG  . EKG 12-Lead  . EKG 12-Lead    ASSESSMENT AND PLAN:  Active Problems:   Pleural effusion  #1. Malignant pleural effusion, thoracentesis is being planned for today, cytology will be taken, oncologist, as well as pulmonologist consultations are pending, discussed with pulmonologist, Dr. Mortimer Fries today #2. Left hilar mass concerning for bronchogenic carcinoma, cytology will be taken from thoracentesis fluid, oncology consultation is pending, possible discharge home tomorrow after oncologist input #3. Failure to thrive adult, follow oral intake, patient was able to eat 100% of offered meals earlier  today #4. Hyponatremia, resolved with IV fluid administration, now off IV fluids #5. Thrombocytosis, likely reactive, follow closely #6. Anemia. With rehydration, follow tomorrow morning #7. Hyperglycemia, due to squamous cell cancer?, Follow in the morning. Initiate patient on calcitonin.   Management plans discussed with the patient, family and they are in agreement.   DRUG ALLERGIES: No Known Allergies  CODE STATUS:     Code Status Orders        Start     Ordered   08/16/16 2038  Full code  Continuous     08/16/16 2037    Code Status History    Date Active Date Inactive Code Status Order ID Comments User Context   This patient has a current code status but no historical code status.    Advance Directive Documentation   Flowsheet Row Most Recent Value  Type of Advance Directive  Healthcare Power of Attorney  Pre-existing out of facility DNR order (yellow form or pink MOST form)  No  data  "MOST" Form in Place?  No data      TOTAL TIME TAKING CARE OF THIS PATIENT: 40 minutes.    Theodoro Grist M.D on 08/17/2016 at 4:07 PM  Between 7am to 6pm - Pager - [phone]  After 6pm go to [email or web address] - password [email or web address] [facility]  [facility]  Office  [phone]  CC: Primary care physician; No primary care provider on file.

## 2016-08-17 NOTE — Progress Notes (Addendum)
Pharmacy Antibiotic Note  Gabriela Jordan is a 80 y.o. female admitted on 08/16/2016 with pneumonia.  Pharmacy has been consulted for Levaquin dosing. Patient received Levaquin '500mg'$  IV x one in the ED.  Plan: Will initiate levofloxacin '750mg'$  IV Q48h to start tomorrow.    Height: '5\' 2"'$  (157.5 cm) Weight: 92 lb (41.7 kg) IBW/kg (Calculated) : 50.1  Temp (24hrs), Avg:97.8 F (36.6 C), Min:97.6 F (36.4 C), Max:98 F (36.7 C)   Recent Labs Lab 08/16/16 1423 08/17/16 0516  WBC 10.6 8.3  CREATININE 0.77 0.66    Estimated Creatinine Clearance: 38.2 mL/min (by C-G formula based on SCr of 0.66 mg/dL).    No Known Allergies  Antimicrobials this admission: Levaquin 11/26 >>   Dose adjustments this admission: Levaquin Q48hr  Microbiology results: 11/26 UCx:  Thank you for allowing pharmacy to be a part of this patient's care.  Loree Fee, PharmD 08/17/2016 5:00 PM

## 2016-08-17 NOTE — Procedures (Addendum)
US guided left thoracentesis.  Removed 875 ml of yellow fluid.  Minimal blood loss and no immediate complication.

## 2016-08-17 NOTE — Evaluation (Signed)
Physical Therapy Evaluation Patient Details Name: Gabriela Jordan MRN: [id number] DOB: Feb 14, 1936 Today's Date: 08/17/2016   History of Present Illness  Pt is a 80 y/o F with chief complaint of dizziness, significant weight loss of 10 lbs over the last 10 days, and decreased apetite, nausea and vomiting.  CT head is normal, CT chest and abdomen revealed L lung mass and loculated pleural effusion.    Clinical Impression  Pt admitted with above diagnosis. Pt currently with functional limitations due to the deficits listed below (see PT Problem List). Ms. Lococo denies any dizziness during session and per pt's history provided her dizziness is likely related to her decreased appetite and food intake for the past few weeks.  She currently requires min guard assist for transfers and ambulation as pt reports generalized fatigue as would be expected.  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  Pt was ambulating with a cane and reports one fall over the past 6 months.  She scored a 33/56 on the [employer] indicating she is at a higher risk of falling, recommending HHPT to address this at d/c. Her daughter, who is a Marine scientist, will be moving into pt's home to assist with caring for pt and pt's husband 24/7. Pt will benefit from skilled PT to increase their independence and safety with mobility to allow discharge to the venue listed below.      Follow Up Recommendations Home health PT    Equipment Recommendations  None recommended by PT    Recommendations for Other Services       Precautions / Restrictions Precautions Precautions: Fall Restrictions Weight Bearing Restrictions: No      Mobility  Bed Mobility Overal bed mobility: Modified Independent Bed Mobility: Supine to Sit     Supine to sit: Modified independent (Device/Increase time)     General bed mobility comments: Increased time and effort but no physical assist or cues needed.  Transfers Overall transfer  level: Needs assistance Equipment used: Rolling walker (2 wheeled) Transfers: Sit to/from Stand Sit to Stand: Min guard         General transfer comment: Slow to stand but steady. Cues for hand placement and technique.  Ambulation/Gait Ambulation/Gait assistance: Min guard Ambulation Distance (Feet): 250 Feet Assistive device: Rolling walker (2 wheeled) Gait Pattern/deviations: Step-through pattern;Decreased stride length;Trunk flexed Gait velocity: decreased Gait velocity interpretation: Below normal speed for age/gender General Gait Details: Slow steady gait, pt reporting fatigue at end of ambulation once back in room and close min guard provided for safety.  Cues for upright posture while ambulating.  Stairs            Wheelchair Mobility    Modified Rankin (Stroke Patients Only)       Balance Overall balance assessment: Needs assistance;History of Falls Sitting-balance support: No upper extremity supported;Feet supported Sitting balance-Leahy Scale: Good     Standing balance support: No upper extremity supported;During functional activity Standing balance-Leahy Scale: Fair                   Standardized Balance Assessment Standardized Balance Assessment : Berg Balance Test Berg Balance Test Sit to Stand: Able to stand  independently using hands Standing Unsupported: Able to stand safely 2 minutes Sitting with Back Unsupported but Feet Supported on Floor or Stool: Able to sit safely and securely 2 minutes Stand to Sit: Controls descent by using hands Transfers: Able to transfer safely, definite need of hands Standing Unsupported with Eyes  Closed: Able to stand 10 seconds with supervision Standing Ubsupported with Feet Together: Able to place feet together independently but unable to hold for 30 seconds From Standing, Reach Forward with Outstretched Arm: Can reach forward >12 cm safely (5") From Standing Position, Pick up Object from Floor: Unable to  try/needs assist to keep balance From Standing Position, Turn to Look Behind Over each Shoulder: Looks behind from both sides and weight shifts well Turn 360 Degrees: Able to turn 360 degrees safely but slowly Standing Unsupported, Alternately Place Feet on Step/Stool: Able to complete 4 steps without aid or supervision Standing Unsupported, One Foot in Front: Loses balance while stepping or standing Standing on One Leg: Unable to try or needs assist to prevent fall Total Score: 33         Pertinent Vitals/Pain Pain Assessment: No/denies pain    Home Living Family/patient expects to be discharged to:: Private residence Living Arrangements: Spouse/significant other;Children (daughter is moving in) Available Help at Discharge: Family;Available 24 hours/day (daughter to care for pt and pt's husband) Type of Home: House Home Access: Level entry     Home Layout: One level Home Equipment: Wheelchair - manual;Cane - single point;Walker - 4 wheels      Prior Function Level of Independence: Independent with assistive device(s)         Comments: Pt was ambulating with cane PTA.  If pt went grocery shopping she would have to do a quick trip as she would fatigue.  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  1 fall in the past 6 months resulting from tripping in front yard.     Hand Dominance   Dominant Hand: Right    Extremity/Trunk Assessment   Upper Extremity Assessment: Overall WFL for tasks assessed           Lower Extremity Assessment: Overall WFL for tasks assessed      Cervical / Trunk Assessment: Kyphotic  Communication   Communication: No difficulties  Cognition Arousal/Alertness: Awake/alert Behavior During Therapy: WFL for tasks assessed/performed Overall Cognitive Status: Within Functional Limits for tasks assessed                      General Comments General comments (skin integrity, edema, etc.): Pt denies dizziness during  session.  She nausea after ambulating.  She denies changes in vision and when she does get dizzy she reports the only thing that cause her dizziness is getting up too fast. Dizziness likely related to decreased appetite and weight loss over the past few weeks.    Exercises General Exercises - Lower Extremity Ankle Circles/Pumps: AROM;Both;10 reps;Seated   Assessment/Plan    PT Assessment Patient needs continued PT services  PT Problem List Decreased activity tolerance;Decreased balance;Decreased safety awareness;Decreased knowledge of use of DME          PT Treatment Interventions DME instruction;Gait training;Functional mobility training;Therapeutic activities;Therapeutic exercise;Balance training;Patient/family education    PT Goals (Current goals can be found in the Care Plan section)  Acute Rehab PT Goals Patient Stated Goal: to go home PT Goal Formulation: With patient Time For Goal Achievement: 08/31/16 Potential to Achieve Goals: Good    Frequency Min 2X/week   Barriers to discharge        Co-evaluation               End of Session Equipment Utilized During Treatment: Gait belt Activity Tolerance: Patient tolerated treatment well;Patient limited by fatigue Patient left: in chair;with call  bell/phone within reach;with chair alarm set Nurse Communication: Mobility status    Functional Assessment Tool Used: Clinical Judgement, Berg Functional Limitation: Mobility: Walking and moving around Mobility: Walking and Moving Around Current Status ([id number]): At least 20 percent but less than 40 percent impaired, limited or restricted Mobility: Walking and Moving Around Goal Status [phone]): At least 1 percent but less than 20 percent impaired, limited or restricted    Time: 3912-2583 PT Time Calculation (min) (ACUTE ONLY): 34 min   Charges:   PT Evaluation $PT Eval Low Complexity: 1 Procedure PT Treatments $Gait Training: 8-22 mins   PT G Codes:   PT G-Codes **NOT  FOR INPATIENT CLASS** Functional Assessment Tool Used: Clinical Judgement, Berg Functional Limitation: Mobility: Walking and moving around Mobility: Walking and Moving Around Current Status [phone]): At least 20 percent but less than 40 percent impaired, limited or restricted Mobility: Walking and Moving Around Goal Status [phone]): At least 1 percent but less than 20 percent impaired, limited or restricted    Collie Siad PT, DPT 08/17/2016, 12:19 PM

## 2016-08-17 NOTE — Progress Notes (Signed)
Order for enoxaparin 40 mg subcutaneously daily for DVT prophylaxis has been changed to 30 mg daily dose per anticoagulation protocol for CrCl > 30 mL/min but total body weight < 45 kg.   Lenis Noon, PharmD, BCPS 08/17/16 3:50 PM

## 2016-08-17 NOTE — Progress Notes (Signed)
Initial Nutrition Assessment  DOCUMENTATION CODES:   Severe malnutrition in context of chronic illness  INTERVENTION:  1. Glucerna Shake po TID, each supplement provides 220 kcal and 10 grams of protein 2. Monitor and Encourage PO intake  NUTRITION DIAGNOSIS:   Malnutrition related to chronic illness as evidenced by severe depletion of muscle mass, severe depletion of body fat, percent weight loss.  GOAL:   Patient will meet greater than or equal to 90% of their needs  MONITOR:   PO intake, I & O's, Labs, Weight trends, Supplement acceptance  REASON FOR ASSESSMENT:   Malnutrition Screening Tool    ASSESSMENT:   Gabriela Jordan  is a 80 y.o. female with a known history of hypothyroidism , borderline DMIs presenting to the ED with a chief complaint of dizziness but denies any loss of consciousness.  Ms. Bartolini presents with reported 10#/9.8% severe wt loss over 10 days. At home she had a poor appetite and nausea, vomiting, reports she ate almost nothing during that time. Work up here revealed a left lung mass and pleural effusion, likely cause of her poor appetite, nausea, vomiting. States before this illness she "was never a big eater" but was consuming at least 2 meals per day. Nutrition-Focused physical exam completed. Findings are severe fat depletion, severe muscle depletion, and no edema.  Skipped breakfast this morning, states she was nauseated. Labs and medications reviewed: NS @ 119m/hr  Diet Order:  Diet Carb Modified Fluid consistency: Thin; Room service appropriate? Yes  Skin:  Reviewed, no issues  Last BM:  \11/23  Height:   Ht Readings from Last 1 Encounters:  08/16/16 '5\' 2"'$  (1.575 m)    Weight:   Wt Readings from Last 1 Encounters:  08/16/16 92 lb (41.7 kg)    Ideal Body Weight:  50 kg  BMI:  Body mass index is 16.83 kg/m.  Estimated Nutritional Needs:   Kcal:  1250-1500 calories  Protein:  54-71 gm  Fluid:  >/= 1.25L  EDUCATION NEEDS:    No education needs identified at this time  WSatira Anis Irean Kendricks, MS, RD LDN Inpatient Clinical Dietitian Pager [phone]

## 2016-08-17 NOTE — Consult Note (Signed)
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  INTERVAL HISTORY: Patient is a 80 year old female who recently presented to the emergency room complaining of increased dizziness without loss of consciousness. She also complained of an approximately 10 pound weight loss in the last 1-2 weeks. She has had a poor appetite and increased nausea and vomiting over the same time frame. Subsequent workup included a CT of the chest which revealed a large 11 cm left lung mass for likely malignant pleural effusion. Patient recently underwent thoracentesis. She is a poor historian making review of systems difficult and currently is denying any knowledge of her suspected malignancy.  REVIEW OF SYSTEMS:   Review of Systems  Constitutional: Positive for malaise/fatigue and weight loss. Negative for fever.  Respiratory: Negative.  Negative for cough and shortness of breath.   Cardiovascular: Negative.  Negative for chest pain and leg swelling.  Gastrointestinal: Negative.  Negative for abdominal pain.  Genitourinary: Negative.   Musculoskeletal: Negative.   Neurological: Positive for dizziness and weakness. Negative for sensory change.  Psychiatric/Behavioral: Positive for memory loss. The patient is not nervous/anxious.     As per HPI. Otherwise, a complete review of systems is negative.  PAST MEDICAL HISTORY: Past Medical History:  Diagnosis Date  . Hypothyroid     PAST SURGICAL HISTORY: Past Surgical History:  Procedure Laterality Date  . CHOLECYSTECTOMY    . THYROIDECTOMY    . TONSILLECTOMY      FAMILY HISTORY: History reviewed. No pertinent family history.  ADVANCED DIRECTIVES (Y/N):  '@ADVDIR'$ @  HEALTH MAINTENANCE: Social History  Substance Use Topics  . Smoking status: Former Research scientist (life sciences)    . Smokeless tobacco: Never Used  . Alcohol use No     Colonoscopy:  PAP:  Bone density:  Lipid panel:  No Known Allergies  Current Facility-Administered Medications  Medication Dose Route Frequency Provider Last Rate Last Dose  . 0.9 %  sodium chloride infusion   Intravenous Continuous Lytle Butte, MD 100 mL/hr at 08/17/16 2021    . acetaminophen (TYLENOL) tablet 650 mg  650 mg Oral Q6H PRN Nicholes Mango, MD       Or  . acetaminophen (TYLENOL) suppository 650 mg  650 mg Rectal Q6H PRN Nicholes Mango, MD      . Derrill Memo ON 08/18/2016] aspirin EC tablet 81 mg  81 mg Oral Daily Theodoro Grist, MD      . calcitonin (salmon) (MIACALCIN/FORTICAL) nasal spray 1 spray  1 spray Alternating Nares Daily Theodoro Grist, MD      . enoxaparin (LOVENOX) injection 30 mg  30 mg Subcutaneous Q24H Theodoro Grist, MD   30 mg at 08/17/16 2021  . feeding supplement (ENSURE ENLIVE) (ENSURE ENLIVE) liquid 237 mL  237 mL Oral TID BM Theodoro Grist, MD   237 mL at 08/17/16 2022  . insulin aspart (novoLOG) injection 0-5 Units  0-5 Units Subcutaneous QHS Aruna Gouru, MD      . insulin aspart (novoLOG) injection 0-9 Units  0-9 Units Subcutaneous TID WC Nicholes Mango, MD   1 Units at 08/17/16 1720  . [START ON 08/18/2016] levofloxacin (LEVAQUIN) IVPB 750 mg  750 mg Intravenous Q48H Loree Fee, RPH      . [START ON 08/18/2016] levothyroxine (SYNTHROID, LEVOTHROID) tablet 50 mcg  50 mcg Oral QAC breakfast Theodoro Grist, MD      .  mirtazapine (REMERON) tablet 15 mg  15 mg Oral QHS Theodoro Grist, MD   15 mg at 08/17/16 2021  . ondansetron (ZOFRAN) tablet 4 mg  4 mg Oral Q6H PRN Nicholes Mango, MD       Or  . ondansetron (ZOFRAN) injection 4 mg  4 mg Intravenous Q6H PRN Nicholes Mango, MD   4 mg at 08/17/16 1709  . pantoprazole (PROTONIX) EC tablet 40 mg  40 mg Oral BID Theodoro Grist, MD   40 mg at 08/17/16 2021  . senna-docusate (Senokot-S) tablet 1 tablet  1 tablet Oral QHS PRN Nicholes Mango, MD      . sodium chloride flush (NS)  0.9 % injection 3 mL  3 mL Intravenous Q12H Nicholes Mango, MD   3 mL at 08/16/16 2110  . traMADol (ULTRAM) tablet 50 mg  50 mg Oral Q6H PRN Nicholes Mango, MD        OBJECTIVE: Vitals:   08/17/16 1415 08/17/16 2050  BP: 140/75 138/72  Pulse: 90 92  Resp: 17 20  Temp:  98.3 F (36.8 C)     Body mass index is 16.83 kg/m.    ECOG FS:2 - Symptomatic, <50% confined to bed  General: Well-developed, well-nourished, no acute distress. Eyes: Pink conjunctiva, anicteric sclera. HEENT: Normocephalic, moist mucous membranes, clear oropharnyx. Lungs: Clear to auscultation bilaterally. Heart: Regular rate and rhythm. No rubs, murmurs, or gallops. Abdomen: Soft, nontender, nondistended. No organomegaly noted, normoactive bowel sounds. Musculoskeletal: No edema, cyanosis, or clubbing. Neuro: Alert, answering all questions appropriately. Cranial nerves grossly intact. Skin: No rashes or petechiae noted. Psych: Normal affect. Lymphatics: No cervical, calvicular, axillary or inguinal LAD.   LAB RESULTS:  Lab Results  Component Value Date   NA 137 08/17/2016   K 3.5 08/17/2016   CL 105 08/17/2016   CO2 27 08/17/2016   GLUCOSE 92 08/17/2016   BUN 10 08/17/2016   CREATININE 0.66 08/17/2016   CALCIUM 10.4 (H) 08/17/2016   PROT 6.9 08/17/2016   ALBUMIN 2.7 (L) 08/17/2016   AST 12 (L) 08/17/2016   ALT 9 (L) 08/17/2016   ALKPHOS 109 08/17/2016   BILITOT 0.7 08/17/2016   GFRNONAA >60 08/17/2016   GFRAA >60 08/17/2016    Lab Results  Component Value Date   WBC 8.3 08/17/2016   HGB 10.5 (L) 08/17/2016   HCT 31.2 (L) 08/17/2016   MCV 78.5 (L) 08/17/2016   PLT 481 (H) 08/17/2016     STUDIES: Dg Chest 1 View  Result Date: 08/17/2016 CLINICAL DATA:  Post thoracentesis.  Left effusion. EXAM: CHEST 1 VIEW COMPARISON:  August 16, 2016 FINDINGS: A large left effusion remains despite interval thoracentesis. However, the persistent effusion is smaller in the interval. No pneumothorax. The right  lung remains clear. No other interval changes. IMPRESSION: Large left pleural effusion, smaller in the interval, with no pneumothorax after thoracentesis. Electronically Signed   By: Dorise Bullion III M.D   On: 08/17/2016 16:56   Dg Chest 2 View  Result Date: 08/16/2016 CLINICAL DATA:  Nausea and vomiting EXAM: CHEST  2 VIEW COMPARISON:  None. FINDINGS: Cardiac shadow is obscured by a large left-sided pleural effusion. Additionally there is increased density projecting superiorly at in the left upper lobe likely related to consolidation in the superior segment of the left lower lobe. The right lung is clear with the exception of a few calcified granulomas. No bony abnormality is noted. IMPRESSION: Large left pleural effusion and apparent consolidation likely involving the superior segment of the  left lower lobe. CT may be helpful for further evaluation. Electronically Signed   By: Inez Catalina M.D.   On: 08/16/2016 14:46   Ct Head Wo Contrast  Result Date: 08/16/2016 CLINICAL DATA:  Dizziness EXAM: CT HEAD WITHOUT CONTRAST TECHNIQUE: Contiguous axial images were obtained from the base of the skull through the vertex without intravenous contrast. COMPARISON:  None. FINDINGS: Brain: No evidence of acute infarction, hemorrhage, hydrocephalus, extra-axial collection or mass lesion/mass effect. Diffuse atrophic changes are noted. Vascular: No hyperdense vessel or unexpected calcification. Skull: Normal. Negative for fracture or focal lesion. Sinuses/Orbits: No acute finding. Other: None. IMPRESSION: Atrophic changes without acute abnormality. Electronically Signed   By: Inez Catalina M.D.   On: 08/16/2016 14:47   Ct Chest W Contrast  Result Date: 08/16/2016 CLINICAL DATA:  Possible metastatic disease EXAM: CT CHEST, ABDOMEN, AND PELVIS WITH CONTRAST TECHNIQUE: Multidetector CT imaging of the chest, abdomen and pelvis was performed following the standard protocol during bolus administration of intravenous  contrast. CONTRAST:  68m ISOVUE-300 IOPAMIDOL (ISOVUE-300) INJECTION 61% COMPARISON:  None. FINDINGS: CT CHEST FINDINGS Cardiovascular: Heart size within normal limits. No pericardial effusion. Central pulmonary artery is unremarkable. Atherosclerotic calcifications of thoracic aorta. Mediastinum/Nodes, lungs and pleura: There is a multilobulated mass in left hilum left upper lobe with central necrotic center. The mass measures at least 10.2 by 6.2 cm. The mass is best visualized in coronal image 42 extends about 11.4 cm cranial caudally by 7.3 cm transverse dimension. There is complete obstruction of left upper lobe bronchus. Obstruction of lingular bronchus. There is loculated large left pleural effusion probable malignant. There is significant heterogeneous expansile lung parenchyma in apex and superior segment of the left upper lobe. Findings highly suspicious for alveolar tumor extension. Entrapped lung parenchyma with proteinaceous or hemorrhagic alveolar products cannot be excluded as is expansile appearance of the major fissure. Similar appearance is noted just inferior to the tumor in lingula coronal image 38. There is partial atelectasis in left lower lobe. Findings highly suspicious for malignancy. Correlation with PET scan fluid analysis in pulmonology consult is recommended. No definite pleural thickening or pleural enhancement. Musculoskeletal: No destructive bony lesions are noted. Mild degenerative changes mid thoracic spine. Sagittal view of the sternum is unremarkable. No destructive rib lesions. CT ABDOMEN PELVIS FINDINGS Hepatobiliary: Enhanced liver shows mild intrahepatic biliary ductal dilatation. At least 2 cysts are noted in right hepatic lobe anteriorly the largest measures 8 mm. The patient is status postcholecystectomy. CBD measures 6 mm in diameter. Pancreas: Enhanced pancreas shows no focal mass or acute pancreatitis. Spleen: Enhanced spleen is normal. Adrenals/Urinary Tract: There is  mild nodular thickening of right adrenal gland measures 8 mm. Mild thickening of left adrenal gland measures 9 mm. Metastatic disease cannot be excluded. Bilateral kidneys shows normal enhancement. No hydronephrosis or hydroureter. Delayed renal images shows bilateral renal symmetrical excretion. Stomach/Bowel: No small bowel obstruction. No pericecal inflammation. The terminal ileum is unremarkable. Normal appendix partially visualized in axial image 86. Colonic diverticula are noted sigmoid colon. There is redundant sigmoid colon. No evidence of acute diverticulitis. No evidence of acute colitis. Vascular/Lymphatic: Atherosclerotic calcifications of abdominal aorta and iliac arteries. No aortic aneurysm. No adenopathy. Reproductive: The uterus is not identified. No adnexal mass. There is mild thickening of vulvar wall. Clinical correlation is necessary. Other: No ascites or free abdominal air. The urinary bladder is under distended grossly unremarkable. Musculoskeletal: There are degenerative changes lumbar spine. Disc space flattening at L2-L3 level with vacuum disc phenomenon. Degenerative changes  pubic symphysis. No destructive bony lesions are noted. There is dextroscoliosis of thoracic spine. IMPRESSION: 1. There is a dominant mass in left hilum left upper lobe extending in lingula. The mass measures at least 11.4 x 10.2 by 7.3 cm. There is complete obstruction of the bronchus in left upper lobe and lingula. There is low-density central necrotic center. This is highly suspicious for malignancy such as bronchogenic carcinoma. Further evaluation with PET scan fluid analysis and pulmonology consult is recommended. No definite evidence of pleural enhancement to suggest pleural metastatic disease. No significant mediastinal adenopathy 2. There is expansile heterogeneous appearance of lung parenchyma in superior segment of left upper lobe and left apex. Findings suspicious for alveolar tumor spread or trapped lung  with hemorrhagic or proteinaceous alveolar products. 3. There is loculated left pleural effusion. Partial atelectasis in left lower lobe. 4. The right lung is clear.  No pulmonary edema. 5. Mild bilateral nodular thickening of adrenal glands. Metastatic disease cannot be excluded. 6. No evidence of hepatic metastatic disease. Status postcholecystectomy. 7. No hydronephrosis or hydroureter. 8. No small bowel obstruction. 9. Colonic diverticula are noted left colon and sigmoid colon. No evidence of acute diverticulitis. 10. Degenerative changes lumbar spine. Dextroscoliosis of thoracic spine. 11. The uterus is not identified probable surgically absent. There is thickening of the wall of the vulva. Clinical correlation is necessary. Electronically Signed   By: Lahoma Crocker M.D.   On: 08/16/2016 17:24   Ct Abdomen Pelvis W Contrast  Result Date: 08/16/2016 CLINICAL DATA:  Possible metastatic disease EXAM: CT CHEST, ABDOMEN, AND PELVIS WITH CONTRAST TECHNIQUE: Multidetector CT imaging of the chest, abdomen and pelvis was performed following the standard protocol during bolus administration of intravenous contrast. CONTRAST:  31m ISOVUE-300 IOPAMIDOL (ISOVUE-300) INJECTION 61% COMPARISON:  None. FINDINGS: CT CHEST FINDINGS Cardiovascular: Heart size within normal limits. No pericardial effusion. Central pulmonary artery is unremarkable. Atherosclerotic calcifications of thoracic aorta. Mediastinum/Nodes, lungs and pleura: There is a multilobulated mass in left hilum left upper lobe with central necrotic center. The mass measures at least 10.2 by 6.2 cm. The mass is best visualized in coronal image 42 extends about 11.4 cm cranial caudally by 7.3 cm transverse dimension. There is complete obstruction of left upper lobe bronchus. Obstruction of lingular bronchus. There is loculated large left pleural effusion probable malignant. There is significant heterogeneous expansile lung parenchyma in apex and superior segment of  the left upper lobe. Findings highly suspicious for alveolar tumor extension. Entrapped lung parenchyma with proteinaceous or hemorrhagic alveolar products cannot be excluded as is expansile appearance of the major fissure. Similar appearance is noted just inferior to the tumor in lingula coronal image 38. There is partial atelectasis in left lower lobe. Findings highly suspicious for malignancy. Correlation with PET scan fluid analysis in pulmonology consult is recommended. No definite pleural thickening or pleural enhancement. Musculoskeletal: No destructive bony lesions are noted. Mild degenerative changes mid thoracic spine. Sagittal view of the sternum is unremarkable. No destructive rib lesions. CT ABDOMEN PELVIS FINDINGS Hepatobiliary: Enhanced liver shows mild intrahepatic biliary ductal dilatation. At least 2 cysts are noted in right hepatic lobe anteriorly the largest measures 8 mm. The patient is status postcholecystectomy. CBD measures 6 mm in diameter. Pancreas: Enhanced pancreas shows no focal mass or acute pancreatitis. Spleen: Enhanced spleen is normal. Adrenals/Urinary Tract: There is mild nodular thickening of right adrenal gland measures 8 mm. Mild thickening of left adrenal gland measures 9 mm. Metastatic disease cannot be excluded. Bilateral kidneys shows normal enhancement.  No hydronephrosis or hydroureter. Delayed renal images shows bilateral renal symmetrical excretion. Stomach/Bowel: No small bowel obstruction. No pericecal inflammation. The terminal ileum is unremarkable. Normal appendix partially visualized in axial image 86. Colonic diverticula are noted sigmoid colon. There is redundant sigmoid colon. No evidence of acute diverticulitis. No evidence of acute colitis. Vascular/Lymphatic: Atherosclerotic calcifications of abdominal aorta and iliac arteries. No aortic aneurysm. No adenopathy. Reproductive: The uterus is not identified. No adnexal mass. There is mild thickening of vulvar  wall. Clinical correlation is necessary. Other: No ascites or free abdominal air. The urinary bladder is under distended grossly unremarkable. Musculoskeletal: There are degenerative changes lumbar spine. Disc space flattening at L2-L3 level with vacuum disc phenomenon. Degenerative changes pubic symphysis. No destructive bony lesions are noted. There is dextroscoliosis of thoracic spine. IMPRESSION: 1. There is a dominant mass in left hilum left upper lobe extending in lingula. The mass measures at least 11.4 x 10.2 by 7.3 cm. There is complete obstruction of the bronchus in left upper lobe and lingula. There is low-density central necrotic center. This is highly suspicious for malignancy such as bronchogenic carcinoma. Further evaluation with PET scan fluid analysis and pulmonology consult is recommended. No definite evidence of pleural enhancement to suggest pleural metastatic disease. No significant mediastinal adenopathy 2. There is expansile heterogeneous appearance of lung parenchyma in superior segment of left upper lobe and left apex. Findings suspicious for alveolar tumor spread or trapped lung with hemorrhagic or proteinaceous alveolar products. 3. There is loculated left pleural effusion. Partial atelectasis in left lower lobe. 4. The right lung is clear.  No pulmonary edema. 5. Mild bilateral nodular thickening of adrenal glands. Metastatic disease cannot be excluded. 6. No evidence of hepatic metastatic disease. Status postcholecystectomy. 7. No hydronephrosis or hydroureter. 8. No small bowel obstruction. 9. Colonic diverticula are noted left colon and sigmoid colon. No evidence of acute diverticulitis. 10. Degenerative changes lumbar spine. Dextroscoliosis of thoracic spine. 11. The uterus is not identified probable surgically absent. There is thickening of the wall of the vulva. Clinical correlation is necessary. Electronically Signed   By: Lahoma Crocker M.D.   On: 08/16/2016 17:24   US Thoracentesis  Asp Pleural Space W/img Guide  Result Date: 08/17/2016 INDICATION: Left lung mass and left pleural effusion. Request for diagnostic left thoracentesis. EXAM: ULTRASOUND GUIDED LEFT THORACENTESIS MEDICATIONS: None. COMPLICATIONS: None immediate. PROCEDURE: An ultrasound guided thoracentesis was thoroughly discussed with the patient and questions answered. The benefits, risks, alternatives and complications were also discussed. The patient understands and wishes to proceed with the procedure. Written consent was obtained. Ultrasound was performed to localize and mark an adequate pocket of fluid in the left posterior chest. The area was then prepped and draped in the normal sterile fashion. 1% Lidocaine was used for local anesthesia. Under ultrasound guidance a 6 Fr Safe-T-Centesis catheter was introduced. Thoracentesis was performed. The catheter was removed and a dressing applied. FINDINGS: A total of approximately 875 mL of yellow fluid was removed. Samples were sent to the laboratory as requested by the clinical team. IMPRESSION: Successful ultrasound guided left thoracentesis yielding 875 mL of pleural fluid. Electronically Signed   By: Markus Daft M.D.   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  PLAN:    1. Left lung mass: Highly suspicious for underlying malignancy. Will await cytology results from thoracentesis of pleural effusion. If unrevealing, patient may benefit from CT-guided biopsy. Appreciate pulmonary input. Patient will require a  PET scan to complete staging workup, but this can be accomplished as an outpatient if patient is discharged. CT of the head is negative for metastatic disease. Okay to discharge from an oncology standpoint. Please insure patient has follow-up in the [place] in approximately one week to discuss her results and additional staging workup.  Appreciate consult, will follow.  Lloyd Huger, MD   08/17/2016  10:21 PM

## 2016-08-18 DIAGNOSIS — R627 Adult failure to thrive: Secondary | ICD-10-CM

## 2016-08-18 DIAGNOSIS — D649 Anemia, unspecified: Secondary | ICD-10-CM

## 2016-08-18 DIAGNOSIS — R918 Other nonspecific abnormal finding of lung field: Secondary | ICD-10-CM

## 2016-08-18 DIAGNOSIS — E860 Dehydration: Secondary | ICD-10-CM

## 2016-08-18 DIAGNOSIS — D75839 Thrombocytosis, unspecified: Secondary | ICD-10-CM

## 2016-08-18 DIAGNOSIS — Z9889 Other specified postprocedural states: Secondary | ICD-10-CM

## 2016-08-18 DIAGNOSIS — E871 Hypo-osmolality and hyponatremia: Secondary | ICD-10-CM

## 2016-08-18 DIAGNOSIS — R739 Hyperglycemia, unspecified: Secondary | ICD-10-CM

## 2016-08-18 DIAGNOSIS — D473 Essential (hemorrhagic) thrombocythemia: Secondary | ICD-10-CM

## 2016-08-18 LAB — URINE CULTURE
Culture: <10000 — AB
Special Requests: NORMAL

## 2016-08-18 LAB — HEMOGLOBIN A1C
Hgb A1c MFr Bld: 5.7 % — ABNORMAL HIGH (ref 4.8–5.6)
Mean Plasma Glucose: 117 mg/dL

## 2016-08-18 LAB — HEMOGLOBIN: HEMOGLOBIN: 10.2 g/dL — AB (ref 12.0–16.0)

## 2016-08-18 LAB — GLUCOSE, CAPILLARY
Glucose-Capillary: 99 mg/dL (ref 65–99)
Glucose-Capillary: 117 mg/dL — ABNORMAL HIGH (ref 65–99)

## 2016-08-18 LAB — CALCIUM: CALCIUM: 9.9 mg/dL (ref 8.9–10.3)

## 2016-08-18 MED ORDER — CALCITONIN (SALMON) 200 UNIT/ACT NA SOLN: 1.0000 | Freq: Every day | NASAL | 12 refills | Status: AC

## 2016-08-18 MED ORDER — TRAMADOL HCL 50 MG PO TABS: 50.0000 mg | ORAL_TABLET | Freq: Four times a day (QID) | ORAL | 0 refills | Status: AC | PRN

## 2016-08-18 MED ORDER — SENNOSIDES-DOCUSATE SODIUM 8.6-50 MG PO TABS: 1.0000 | ORAL_TABLET | Freq: Every evening | ORAL | 5 refills | Status: AC | PRN

## 2016-08-18 MED ORDER — LEVOFLOXACIN 750 MG PO TABS: 750.0000 mg | ORAL_TABLET | Freq: Every day | ORAL | 0 refills | Status: DC

## 2016-08-18 MED ORDER — PANTOPRAZOLE SODIUM 40 MG PO TBEC: 40.0000 mg | DELAYED_RELEASE_TABLET | Freq: Two times a day (BID) | ORAL | 3 refills | Status: AC

## 2016-08-18 NOTE — Discharge Summary (Signed)
[facility] NAME: Gabriela Jordan    MR#:  [id number]  DATE OF BIRTH:  1936/06/11  DATE OF ADMISSION:  08/16/2016 ADMITTING PHYSICIAN: Nicholes Mango, MD  DATE OF DISCHARGE: 08/18/2016  4:18 PM  PRIMARY CARE PHYSICIAN: Dion Body, MD     ADMISSION DIAGNOSIS:  Dehydration [E86.0] Dizziness [R42] Weakness [R53.1] Pleural effusion [J90] Pneumonia of left lung due to infectious organism, unspecified part of lung [J18.9]  DISCHARGE DIAGNOSIS:  Principal Problem:   Pleural effusion Active Problems:   Mass of left lung   S/P thoracentesis   Failure to thrive in adult   Hypercalcemia   Hyponatremia   Dehydration   Thrombocytosis (HCC)   Anemia   Hyperglycemia   SECONDARY DIAGNOSIS:   Past Medical History:  Diagnosis Date  . Hypothyroid     .pro HOSPITAL COURSE:   The patient is a 80 year old female with medical history significant for history of hypothyroidism, borderline diabetes mellitus, who presents to the hospital with complaints of dizziness, weight loss of approximately 10 pounds over the past 10 days, decreased appetite, nausea and vomiting. Patient had CT of chest and abdomen done which revealed left lung mass and loculated pleural effusion. Patient was admitted for evaluation and treatment. Consultation with oncologist and pulmonologist was obtained. Thoracentesis was performed, revealing pleural fluid white cell count of 3150, 85% of them were lymphocytes, 10% neutrophils, appearance was hazy, total protein was 4.9, LDH was 180, glucose level 114. Culture , cytology was submitted, pending. The patient was seen by oncologist, Dr. Grayland Ormond, who felt that patient had left lung mass which was highly suspicious for underlying malignancy, he recommended to await cytology results from thoracentesis of pleural fluid, if not revealing, he felt that patient may benefit from CT-guided biopsy. She will also require PET  scan to complete staging workup, which could be achieved as outpatient. Patient's head CT was negative for metastatic disease. Oncology follow-up was recommended in about one week after discharge. I discussed patient's case with her daughter, Gabriela Jordan, who informed me, that she may be interested in palliation if the patient's physical condition does not improve, should she continue to lose weight. We'll felt the patient would benefit from home health services. Discussion by problem: #1. Likely malignant pleural effusion, status post thoracentesis 08/17/2016, cytology , cultures are pending, oncologist, as well as pulmonologist consultations are appreciated, follow-up with Dr. Grayland Ormond as outpatient in about one week, discussed with patient's family, agreeable. The patient will be discharged home with home health services, PT, and RN #2. Left hilar mass concerning for bronchogenic carcinoma, cytology is pending from  thoracentesis fluid,  discharge home with  oncologist follow-up as outpatient #3. Failure to thrive adult, oral intake remained poor, although patient was able to eat 100% of 4 foot meals intermittently. This was discussed this patient's daughter, Gabriela Jordan, who agreed to discuss palliation with oncologist if patient's condition worsens at home, home health services are going to be arranged for her upon discharge #4. Hyponatremia, resolved with IV fluid administration, now off IV fluids, follow as outpatient  #5. Thrombocytosis, likely reactive, follow closely #6. Anemia. With rehydration,, hemoglobin is stable #7. Hyperglycemia, improved with  calcitonin, likely paraneoplastic   DISCHARGE CONDITIONS:   Stable  CONSULTS OBTAINED:  Treatment Team:  Erby Pian, MD Cammie Sickle, MD  DRUG ALLERGIES:  No Known Allergies  DISCHARGE MEDICATIONS:   Discharge Medication List as of 08/18/2016  3:07 PM    START  taking these medications   Details  calcitonin, salmon,  (MIACALCIN/FORTICAL) 200 UNIT/ACT nasal spray Place 1 spray into alternate nostrils daily., Starting Wed 08/19/2016, Normal    levofloxacin (LEVAQUIN) 750 MG tablet Take 1 tablet (750 mg total) by mouth daily., Starting Tue 08/18/2016, Normal    pantoprazole (PROTONIX) 40 MG tablet Take 1 tablet (40 mg total) by mouth 2 (two) times daily., Starting Tue 08/18/2016, Normal    senna-docusate (SENOKOT-S) 8.6-50 MG tablet Take 1 tablet by mouth at bedtime as needed for mild constipation., Starting Tue 08/18/2016, Normal    traMADol (ULTRAM) 50 MG tablet Take 1 tablet (50 mg total) by mouth every 6 (six) hours as needed for moderate pain., Starting Tue 08/18/2016, Normal      CONTINUE these medications which have NOT CHANGED   Details  aspirin EC 81 MG tablet 81 mg., Historical Med    levothyroxine (SYNTHROID, LEVOTHROID) 50 MCG tablet Take 50 mcg by mouth daily before breakfast., Historical Med    mirtazapine (REMERON) 15 MG tablet Take 15 mg by mouth at bedtime., Historical Med    Red Yeast Rice 600 MG TABS Take by mouth., Historical Med      STOP taking these medications     calcium carbonate (OSCAL) 1500 (600 Ca) MG TABS tablet      Cholecalciferol 10000 units TABS          DISCHARGE INSTRUCTIONS:    The patient is to follow-up with Dr. Grayland Ormond within 1 week after discharge  If you experience worsening of your admission symptoms, develop shortness of breath, life threatening emergency, suicidal or homicidal thoughts you must seek medical attention immediately by calling 911 or calling your MD immediately  if symptoms less severe.  You Must read complete instructions/literature along with all the possible adverse reactions/side effects for all the Medicines you take and that have been prescribed to you. Take any new Medicines after you have completely understood and accept all the possible adverse reactions/side effects.   Please note  You were cared for by a hospitalist  during your hospital stay. If you have any questions about your discharge medications or the care you received while you were in the hospital after you are discharged, you can call the unit and asked to speak with the hospitalist on call if the hospitalist that took care of you is not available. Once you are discharged, your primary care physician will handle any further medical issues. Please note that NO REFILLS for any discharge medications will be authorized once you are discharged, as it is imperative that you return to your primary care physician (or establish a relationship with a primary care physician if you do not have one) for your aftercare needs so that they can reassess your need for medications and monitor your lab values.    Today   CHIEF COMPLAINT:   Chief Complaint  Patient presents with  . Nausea  . Near Syncope    HISTORY OF PRESENT ILLNESS:  Attie Nawabi  is a 80 y.o. female with a known history of hypothyroidism, borderline diabetes mellitus, who presents to the hospital with complaints of dizziness, weight loss of approximately 10 pounds over the past 10 days, decreased appetite, nausea and vomiting. Patient had CT of chest and abdomen done which revealed left lung mass and loculated pleural effusion. Patient was admitted for evaluation and treatment. Consultation with oncologist and pulmonologist was obtained. Thoracentesis was performed, revealing pleural fluid white cell count of 3150, 85% of them were  lymphocytes, 10% neutrophils, appearance was hazy, total protein was 4.9, LDH was 180, glucose level 114. Culture , cytology was submitted, pending. The patient was seen by oncologist, Dr. Grayland Ormond, who felt that patient had left lung mass which was highly suspicious for underlying malignancy, he recommended to await cytology results from thoracentesis of pleural fluid, if not revealing, he felt that patient may benefit from CT-guided biopsy. She will also require PET scan to  complete staging workup, which could be achieved as outpatient. Patient's head CT was negative for metastatic disease. Oncology follow-up was recommended in about one week after discharge. I discussed patient's case with her daughter, Gabriela Jordan, who informed me, that she may be interested in palliation if the patient's physical condition does not improve, should she continue to lose weight. We'll felt the patient would benefit from home health services. Discussion by problem: #1. Likely malignant pleural effusion, status post thoracentesis 08/17/2016, cytology , cultures are pending, oncologist, as well as pulmonologist consultations are appreciated, follow-up with Dr. Grayland Ormond as outpatient in about one week, discussed with patient's family, agreeable. The patient will be discharged home with home health services, PT, and RN #2. Left hilar mass concerning for bronchogenic carcinoma, cytology is pending from  thoracentesis fluid,  discharge home with  oncologist follow-up as outpatient. Oxygen saturation was checked on the room air on exertion and it was at 93%, patient did not qualify for home oxygen.  #3. Failure to thrive adult, oral intake remained poor, although patient was able to eat 100% of  offered meals intermittently. This was discussed this patient's daughter, Gabriela Jordan, who agreed to discuss palliation with oncologist if patient's condition worsens at home, home health services are going to be arranged for her upon discharge #4. Hyponatremia, resolved with IV fluid administration, now off IV fluids, follow as outpatient  #5. Thrombocytosis, likely reactive, follow closely #6. Anemia. With rehydration,, hemoglobin is stable #7. Hyperglycemia, improved with  calcitonin, likely paraneoplastic     VITAL SIGNS:  Blood pressure 127/72, pulse 83, temperature 98 F (36.7 C), temperature source Oral, resp. rate 19, height '5\' 2"'$  (1.575 m), weight 41.7 kg (92 lb), SpO2 93 %.  I/O:   Intake/Output Summary  (Last 24 hours) at 08/18/16 1626 Last data filed at 08/18/16 1303  Gross per 24 hour  Intake              776 ml  Output             1750 ml  Net             -974 ml    PHYSICAL EXAMINATION:  GENERAL:  80 y.o.-year-old patient lying in the bed with no acute distress.  EYES: Pupils equal, round, reactive to light and accommodation. No scleral icterus. Extraocular muscles intact.  HEENT: Head atraumatic, normocephalic. Oropharynx and nasopharynx clear.  NECK:  Supple, no jugular venous distention. No thyroid enlargement, no tenderness.  LUNGS: Normal breath sounds On the right, diminished on the left,  no wheezing, rales,rhonchi or crepitation. No use of accessory muscles of respiration.  CARDIOVASCULAR: S1, S2 normal. No murmurs, rubs, or gallops.  ABDOMEN: Soft, non-tender, non-distended. Bowel sounds present. No organomegaly or mass.  EXTREMITIES: No pedal edema, cyanosis, or clubbing.  NEUROLOGIC: Cranial nerves II through XII are intact. Muscle strength 5/5 in all extremities. Sensation intact. Gait not checked.  PSYCHIATRIC: The patient is alert and oriented x 3.  SKIN: No obvious rash, lesion, or ulcer.   DATA REVIEW:   CBC  Recent Labs Lab 08/17/16 0516 08/18/16 0531  WBC 8.3  --   HGB 10.5* 10.2*  HCT 31.2*  --   PLT 481*  --     Chemistries   Recent Labs Lab 08/17/16 0516 08/18/16 0531  NA 137  --   K 3.5  --   CL 105  --   CO2 27  --   GLUCOSE 92  --   BUN 10  --   CREATININE 0.66  --   CALCIUM 10.4* 9.9  AST 12*  --   ALT 9*  --   ALKPHOS 109  --   BILITOT 0.7  --     Cardiac Enzymes No results for input(s): TROPONINI in the last 168 hours.  Microbiology Results  Results for orders placed or performed during the hospital encounter of 08/16/16  Urine culture     Status: Abnormal   Collection Time: 08/16/16  4:12 PM  Result Value Ref Range Status   Specimen Description URINE, CLEAN CATCH  Final   Special Requests Normal  Final   Culture (A)   Final    <10,000 COLONIES/mL INSIGNIFICANT GROWTH Performed at [facility]    Report Status 08/18/2016 FINAL  Final    RADIOLOGY:  Dg Chest 1 View  Result Date: 08/17/2016 CLINICAL DATA:  Post thoracentesis.  Left effusion. EXAM: CHEST 1 VIEW COMPARISON:  August 16, 2016 FINDINGS: A large left effusion remains despite interval thoracentesis. However, the persistent effusion is smaller in the interval. No pneumothorax. The right lung remains clear. No other interval changes. IMPRESSION: Large left pleural effusion, smaller in the interval, with no pneumothorax after thoracentesis. Electronically Signed   By: Dorise Bullion III M.D   On: 08/17/2016 16:56   Ct Chest W Contrast  Result Date: 08/16/2016 CLINICAL DATA:  Possible metastatic disease EXAM: CT CHEST, ABDOMEN, AND PELVIS WITH CONTRAST TECHNIQUE: Multidetector CT imaging of the chest, abdomen and pelvis was performed following the standard protocol during bolus administration of intravenous contrast. CONTRAST:  57m ISOVUE-300 IOPAMIDOL (ISOVUE-300) INJECTION 61% COMPARISON:  None. FINDINGS: CT CHEST FINDINGS Cardiovascular: Heart size within normal limits. No pericardial effusion. Central pulmonary artery is unremarkable. Atherosclerotic calcifications of thoracic aorta. Mediastinum/Nodes, lungs and pleura: There is a multilobulated mass in left hilum left upper lobe with central necrotic center. The mass measures at least 10.2 by 6.2 cm. The mass is best visualized in coronal image 42 extends about 11.4 cm cranial caudally by 7.3 cm transverse dimension. There is complete obstruction of left upper lobe bronchus. Obstruction of lingular bronchus. There is loculated large left pleural effusion probable malignant. There is significant heterogeneous expansile lung parenchyma in apex and superior segment of the left upper lobe. Findings highly suspicious for alveolar tumor extension. Entrapped lung parenchyma with proteinaceous or  hemorrhagic alveolar products cannot be excluded as is expansile appearance of the major fissure. Similar appearance is noted just inferior to the tumor in lingula coronal image 38. There is partial atelectasis in left lower lobe. Findings highly suspicious for malignancy. Correlation with PET scan fluid analysis in pulmonology consult is recommended. No definite pleural thickening or pleural enhancement. Musculoskeletal: No destructive bony lesions are noted. Mild degenerative changes mid thoracic spine. Sagittal view of the sternum is unremarkable. No destructive rib lesions. CT ABDOMEN PELVIS FINDINGS Hepatobiliary: Enhanced liver shows mild intrahepatic biliary ductal dilatation. At least 2 cysts are noted in right hepatic lobe anteriorly the largest measures 8 mm. The patient is status postcholecystectomy. CBD measures 6 mm  in diameter. Pancreas: Enhanced pancreas shows no focal mass or acute pancreatitis. Spleen: Enhanced spleen is normal. Adrenals/Urinary Tract: There is mild nodular thickening of right adrenal gland measures 8 mm. Mild thickening of left adrenal gland measures 9 mm. Metastatic disease cannot be excluded. Bilateral kidneys shows normal enhancement. No hydronephrosis or hydroureter. Delayed renal images shows bilateral renal symmetrical excretion. Stomach/Bowel: No small bowel obstruction. No pericecal inflammation. The terminal ileum is unremarkable. Normal appendix partially visualized in axial image 86. Colonic diverticula are noted sigmoid colon. There is redundant sigmoid colon. No evidence of acute diverticulitis. No evidence of acute colitis. Vascular/Lymphatic: Atherosclerotic calcifications of abdominal aorta and iliac arteries. No aortic aneurysm. No adenopathy. Reproductive: The uterus is not identified. No adnexal mass. There is mild thickening of vulvar wall. Clinical correlation is necessary. Other: No ascites or free abdominal air. The urinary bladder is under distended grossly  unremarkable. Musculoskeletal: There are degenerative changes lumbar spine. Disc space flattening at L2-L3 level with vacuum disc phenomenon. Degenerative changes pubic symphysis. No destructive bony lesions are noted. There is dextroscoliosis of thoracic spine. IMPRESSION: 1. There is a dominant mass in left hilum left upper lobe extending in lingula. The mass measures at least 11.4 x 10.2 by 7.3 cm. There is complete obstruction of the bronchus in left upper lobe and lingula. There is low-density central necrotic center. This is highly suspicious for malignancy such as bronchogenic carcinoma. Further evaluation with PET scan fluid analysis and pulmonology consult is recommended. No definite evidence of pleural enhancement to suggest pleural metastatic disease. No significant mediastinal adenopathy 2. There is expansile heterogeneous appearance of lung parenchyma in superior segment of left upper lobe and left apex. Findings suspicious for alveolar tumor spread or trapped lung with hemorrhagic or proteinaceous alveolar products. 3. There is loculated left pleural effusion. Partial atelectasis in left lower lobe. 4. The right lung is clear.  No pulmonary edema. 5. Mild bilateral nodular thickening of adrenal glands. Metastatic disease cannot be excluded. 6. No evidence of hepatic metastatic disease. Status postcholecystectomy. 7. No hydronephrosis or hydroureter. 8. No small bowel obstruction. 9. Colonic diverticula are noted left colon and sigmoid colon. No evidence of acute diverticulitis. 10. Degenerative changes lumbar spine. Dextroscoliosis of thoracic spine. 11. The uterus is not identified probable surgically absent. There is thickening of the wall of the vulva. Clinical correlation is necessary. Electronically Signed   By: Lahoma Crocker M.D.   On: 08/16/2016 17:24   Ct Abdomen Pelvis W Contrast  Result Date: 08/16/2016 CLINICAL DATA:  Possible metastatic disease EXAM: CT CHEST, ABDOMEN, AND PELVIS WITH  CONTRAST TECHNIQUE: Multidetector CT imaging of the chest, abdomen and pelvis was performed following the standard protocol during bolus administration of intravenous contrast. CONTRAST:  44m ISOVUE-300 IOPAMIDOL (ISOVUE-300) INJECTION 61% COMPARISON:  None. FINDINGS: CT CHEST FINDINGS Cardiovascular: Heart size within normal limits. No pericardial effusion. Central pulmonary artery is unremarkable. Atherosclerotic calcifications of thoracic aorta. Mediastinum/Nodes, lungs and pleura: There is a multilobulated mass in left hilum left upper lobe with central necrotic center. The mass measures at least 10.2 by 6.2 cm. The mass is best visualized in coronal image 42 extends about 11.4 cm cranial caudally by 7.3 cm transverse dimension. There is complete obstruction of left upper lobe bronchus. Obstruction of lingular bronchus. There is loculated large left pleural effusion probable malignant. There is significant heterogeneous expansile lung parenchyma in apex and superior segment of the left upper lobe. Findings highly suspicious for alveolar tumor extension. Entrapped lung parenchyma with proteinaceous  or hemorrhagic alveolar products cannot be excluded as is expansile appearance of the major fissure. Similar appearance is noted just inferior to the tumor in lingula coronal image 38. There is partial atelectasis in left lower lobe. Findings highly suspicious for malignancy. Correlation with PET scan fluid analysis in pulmonology consult is recommended. No definite pleural thickening or pleural enhancement. Musculoskeletal: No destructive bony lesions are noted. Mild degenerative changes mid thoracic spine. Sagittal view of the sternum is unremarkable. No destructive rib lesions. CT ABDOMEN PELVIS FINDINGS Hepatobiliary: Enhanced liver shows mild intrahepatic biliary ductal dilatation. At least 2 cysts are noted in right hepatic lobe anteriorly the largest measures 8 mm. The patient is status postcholecystectomy. CBD  measures 6 mm in diameter. Pancreas: Enhanced pancreas shows no focal mass or acute pancreatitis. Spleen: Enhanced spleen is normal. Adrenals/Urinary Tract: There is mild nodular thickening of right adrenal gland measures 8 mm. Mild thickening of left adrenal gland measures 9 mm. Metastatic disease cannot be excluded. Bilateral kidneys shows normal enhancement. No hydronephrosis or hydroureter. Delayed renal images shows bilateral renal symmetrical excretion. Stomach/Bowel: No small bowel obstruction. No pericecal inflammation. The terminal ileum is unremarkable. Normal appendix partially visualized in axial image 86. Colonic diverticula are noted sigmoid colon. There is redundant sigmoid colon. No evidence of acute diverticulitis. No evidence of acute colitis. Vascular/Lymphatic: Atherosclerotic calcifications of abdominal aorta and iliac arteries. No aortic aneurysm. No adenopathy. Reproductive: The uterus is not identified. No adnexal mass. There is mild thickening of vulvar wall. Clinical correlation is necessary. Other: No ascites or free abdominal air. The urinary bladder is under distended grossly unremarkable. Musculoskeletal: There are degenerative changes lumbar spine. Disc space flattening at L2-L3 level with vacuum disc phenomenon. Degenerative changes pubic symphysis. No destructive bony lesions are noted. There is dextroscoliosis of thoracic spine. IMPRESSION: 1. There is a dominant mass in left hilum left upper lobe extending in lingula. The mass measures at least 11.4 x 10.2 by 7.3 cm. There is complete obstruction of the bronchus in left upper lobe and lingula. There is low-density central necrotic center. This is highly suspicious for malignancy such as bronchogenic carcinoma. Further evaluation with PET scan fluid analysis and pulmonology consult is recommended. No definite evidence of pleural enhancement to suggest pleural metastatic disease. No significant mediastinal adenopathy 2. There is  expansile heterogeneous appearance of lung parenchyma in superior segment of left upper lobe and left apex. Findings suspicious for alveolar tumor spread or trapped lung with hemorrhagic or proteinaceous alveolar products. 3. There is loculated left pleural effusion. Partial atelectasis in left lower lobe. 4. The right lung is clear.  No pulmonary edema. 5. Mild bilateral nodular thickening of adrenal glands. Metastatic disease cannot be excluded. 6. No evidence of hepatic metastatic disease. Status postcholecystectomy. 7. No hydronephrosis or hydroureter. 8. No small bowel obstruction. 9. Colonic diverticula are noted left colon and sigmoid colon. No evidence of acute diverticulitis. 10. Degenerative changes lumbar spine. Dextroscoliosis of thoracic spine. 11. The uterus is not identified probable surgically absent. There is thickening of the wall of the vulva. Clinical correlation is necessary. Electronically Signed   By: Lahoma Crocker M.D.   On: 08/16/2016 17:24   US Thoracentesis Asp Pleural Space W/img Guide  Result Date: 08/17/2016 INDICATION: Left lung mass and left pleural effusion. Request for diagnostic left thoracentesis. EXAM: ULTRASOUND GUIDED LEFT THORACENTESIS MEDICATIONS: None. COMPLICATIONS: None immediate. PROCEDURE: An ultrasound guided thoracentesis was thoroughly discussed with the patient and questions answered. The benefits, risks, alternatives and complications were also  discussed. The patient understands and wishes to proceed with the procedure. Written consent was obtained. Ultrasound was performed to localize and mark an adequate pocket of fluid in the left posterior chest. The area was then prepped and draped in the normal sterile fashion. 1% Lidocaine was used for local anesthesia. Under ultrasound guidance a 6 Fr Safe-T-Centesis catheter was introduced. Thoracentesis was performed. The catheter was removed and a dressing applied. FINDINGS: A total of approximately 875 mL of yellow  fluid was removed. Samples were sent to the laboratory as requested by the clinical team. IMPRESSION: Successful ultrasound guided left thoracentesis yielding 875 mL of pleural fluid. Electronically Signed   By: Markus Daft M.D.   On: 08/17/2016 17:26    EKG:   Orders placed or performed during the hospital encounter of 08/16/16  . ED EKG  . ED EKG  . EKG 12-Lead  . EKG 12-Lead      Management plans discussed with the patient, family and they are in agreement.  CODE STATUS:     Code Status Orders        Start     Ordered   08/16/16 2038  Full code  Continuous     08/16/16 2037    Code Status History    Date Active Date Inactive Code Status Order ID Comments User Context   This patient has a current code status but no historical code status.    Advance Directive Documentation   Flowsheet Row Most Recent Value  Type of Advance Directive  Healthcare Power of Attorney  Pre-existing out of facility DNR order (yellow form or pink MOST form)  No data  "MOST" Form in Place?  No data      TOTAL TIME TAKING CARE OF THIS PATIENT: 40 minutes.  Discussed with Dr. Grayland Ormond and patient's daughter, Gabriela Jordan extensively, all questions were answered, she voiced understanding and appreciation  Ghazal Pevey M.D on 08/18/2016 at 4:26 PM  Between 7am to 6pm - Pager - [phone]  After 6pm go to [email or web address] - password [email or web address] [facility]  Office  [phone]  CC: Primary care physician; Dion Body, MD

## 2016-08-18 NOTE — Progress Notes (Signed)
Pt alert and resting well. Husband has COPD. They care for rescue animals. CH offered prayer.   08/18/16 1120  Clinical Encounter Type  Visited With Patient  Visit Type Initial  Referral From Nurse  Spiritual Encounters  Spiritual Needs Prayer;Emotional

## 2016-08-18 NOTE — Progress Notes (Signed)
Alert and oriented. Vital signs stable . No signs of acute distress. Discharge instructions given. Patient verbalized understanding. No other issues noted at this time.    

## 2016-08-18 NOTE — Care Management (Signed)
Patient admitted for pleural effusion, atatus post thoracentesis .  Patient lives at home with husband.  Adult daughter currently living with daughter for support. Patient states that she has a RW and cane in the home for ambulation.  PCP [place].  Pharmacy [facility]. Home health orders for Pt and RN have been placed.  Home health agency preference provided to patient.  [facility] selected.  Corene Cornea with [facility] notified of referral.  RNCM signing off.

## 2016-08-20 LAB — CYTOLOGY - NON PAP

## 2016-08-20 LAB — BODY FLUID CULTURE: Culture: NO GROWTH

## 2016-08-24 NOTE — Progress Notes (Signed)
[facility]  [phone] [phone]  ID: Gabriela Jordan OB: [birth year]  MR#: [id number]  GUR#:[id number]  Patient Care Team: Dion Body, MD as PCP - General (Family Medicine)  CHIEF COMPLAINT:  Left lung mass.  INTERVAL HISTORY: Patient is a 80 year old female who was initially evaluated in the hospital after having found a large left lung mass and pleural effusion. Thoracentesis of pleural fluid was negative for malignancy. She continues to feel significantly weak and fatigued. She continues to lose weight. She has a poor appetite. She has no neurologic complaints, but seems confused and much of the history is given by her daughter. She denies any fevers. She denies any chest pain or hemoptysis. She has no nausea, vomiting, constipation, or diarrhea. She has no urinary complaints. Patient offers no further specific complaints.  REVIEW OF SYSTEMS:   Review of Systems  Constitutional: Positive for malaise/fatigue and weight loss. Negative for fever.  Respiratory: Positive for cough and shortness of breath. Negative for hemoptysis and sputum production.   Cardiovascular: Negative for chest pain and leg swelling.  Gastrointestinal: Negative.  Negative for abdominal pain.  Genitourinary: Negative.   Musculoskeletal: Negative.   Neurological: Positive for weakness.  Psychiatric/Behavioral: Positive for depression and memory loss. The patient is nervous/anxious.     As per HPI. Otherwise, a complete review of systems is negative.  PAST MEDICAL HISTORY: Past Medical History:  Diagnosis Date  . Hypothyroid     PAST SURGICAL HISTORY: Past Surgical History:  Procedure Laterality Date  . CHOLECYSTECTOMY    . THYROIDECTOMY    . TONSILLECTOMY      FAMILY HISTORY: Reviewed and unchanged. No reported history of malignancy or chronic disease.  ADVANCED DIRECTIVES (Y/N):  N  HEALTH MAINTENANCE: Social History  Substance Use Topics  . Smoking  status: Former Research scientist (life sciences)  . Smokeless tobacco: Never Used  . Alcohol use No     Colonoscopy:  PAP:  Bone density:  Lipid panel:  Allergies  Allergen Reactions  . Ezetimibe Other (See Comments)    Severe myalgias  . Nsaids Hives  . Sulfa Antibiotics Other (See Comments)    Caused hot flashes  . Codeine     Codeine derivatives  . Donepezil Other (See Comments) and Nausea And Vomiting    Caused her to be "deathly sick"- vomiting    Current Outpatient Prescriptions  Medication Sig Dispense Refill  . acetaminophen (TYLENOL) 500 MG tablet Take 500 mg by mouth every 6 (six) hours as needed.    Marland Kitchen aspirin EC 81 MG tablet 81 mg.    . calcitonin, salmon, (MIACALCIN/FORTICAL) 200 UNIT/ACT nasal spray Place 1 spray into alternate nostrils daily. 3.7 mL 12  . levothyroxine (SYNTHROID, LEVOTHROID) 50 MCG tablet Take 50 mcg by mouth daily before breakfast.    . pantoprazole (PROTONIX) 40 MG tablet Take 1 tablet (40 mg total) by mouth 2 (two) times daily. 60 tablet 3  . Red Yeast Rice 600 MG TABS Take by mouth.    . senna-docusate (SENOKOT-S) 8.6-50 MG tablet Take 1 tablet by mouth at bedtime as needed for mild constipation. 30 tablet 5  . traMADol (ULTRAM) 50 MG tablet Take 1 tablet (50 mg total) by mouth every 6 (six) hours as needed for moderate pain. 30 tablet 0  . levofloxacin (LEVAQUIN) 750 MG tablet Take 1 tablet (750 mg total) by mouth daily. (Patient not taking: Reported on 08/25/2016) 4 tablet 0  . mirtazapine (REMERON) 15 MG tablet Take 15 mg by mouth  at bedtime.     No current facility-administered medications for this visit.     OBJECTIVE: Vitals:   08/25/16 1408  BP: 114/75  Pulse: (!) 105  Temp: 97.5 F (36.4 C)     Body mass index is 16.83 kg/m.    ECOG FS:0 - Asymptomatic  General: Well-developed, well-nourished, no acute distress. Eyes: Pink conjunctiva, anicteric sclera. HEENT: Normocephalic, moist mucous membranes, clear oropharnyx. Lungs: Breath sounds diminished  on left. Heart: Regular rate and rhythm. No rubs, murmurs, or gallops. Abdomen: Soft, nontender, nondistended. No organomegaly noted, normoactive bowel sounds. Musculoskeletal: No edema, cyanosis, or clubbing. Neuro: Alert, answering all questions appropriately. Cranial nerves grossly intact. Skin: No rashes or petechiae noted. Psych: Normal affect. Lymphatics: No cervical, calvicular, axillary or inguinal LAD.   LAB RESULTS:  Lab Results  Component Value Date   NA 137 08/17/2016   K 3.5 08/17/2016   CL 105 08/17/2016   CO2 27 08/17/2016   GLUCOSE 92 08/17/2016   BUN 10 08/17/2016   CREATININE 0.66 08/17/2016   CALCIUM 9.9 08/18/2016   PROT 6.9 08/17/2016   ALBUMIN 2.7 (L) 08/17/2016   AST 12 (L) 08/17/2016   ALT 9 (L) 08/17/2016   ALKPHOS 109 08/17/2016   BILITOT 0.7 08/17/2016   GFRNONAA >60 08/17/2016   GFRAA >60 08/17/2016    Lab Results  Component Value Date   WBC 8.3 08/17/2016   HGB 10.2 (L) 08/18/2016   HCT 31.2 (L) 08/17/2016   MCV 78.5 (L) 08/17/2016   PLT 481 (H) 08/17/2016     STUDIES: Dg Chest 1 View  Result Date: 08/17/2016 CLINICAL DATA:  Post thoracentesis.  Left effusion. EXAM: CHEST 1 VIEW COMPARISON:  August 16, 2016 FINDINGS: A large left effusion remains despite interval thoracentesis. However, the persistent effusion is smaller in the interval. No pneumothorax. The right lung remains clear. No other interval changes. IMPRESSION: Large left pleural effusion, smaller in the interval, with no pneumothorax after thoracentesis. Electronically Signed   By: Dorise Bullion III M.D   On: 08/17/2016 16:56   Dg Chest 2 View  Result Date: 08/16/2016 CLINICAL DATA:  Nausea and vomiting EXAM: CHEST  2 VIEW COMPARISON:  None. FINDINGS: Cardiac shadow is obscured by a large left-sided pleural effusion. Additionally there is increased density projecting superiorly at in the left upper lobe likely related to consolidation in the superior segment of the left  lower lobe. The right lung is clear with the exception of a few calcified granulomas. No bony abnormality is noted. IMPRESSION: Large left pleural effusion and apparent consolidation likely involving the superior segment of the left lower lobe. CT may be helpful for further evaluation. Electronically Signed   By: Inez Catalina M.D.   On: 08/16/2016 14:46   Ct Head Wo Contrast  Result Date: 08/16/2016 CLINICAL DATA:  Dizziness EXAM: CT HEAD WITHOUT CONTRAST TECHNIQUE: Contiguous axial images were obtained from the base of the skull through the vertex without intravenous contrast. COMPARISON:  None. FINDINGS: Brain: No evidence of acute infarction, hemorrhage, hydrocephalus, extra-axial collection or mass lesion/mass effect. Diffuse atrophic changes are noted. Vascular: No hyperdense vessel or unexpected calcification. Skull: Normal. Negative for fracture or focal lesion. Sinuses/Orbits: No acute finding. Other: None. IMPRESSION: Atrophic changes without acute abnormality. Electronically Signed   By: Inez Catalina M.D.   On: 08/16/2016 14:47   Ct Chest W Contrast  Result Date: 08/16/2016 CLINICAL DATA:  Possible metastatic disease EXAM: CT CHEST, ABDOMEN, AND PELVIS WITH CONTRAST TECHNIQUE: Multidetector CT imaging  of the chest, abdomen and pelvis was performed following the standard protocol during bolus administration of intravenous contrast. CONTRAST:  57m ISOVUE-300 IOPAMIDOL (ISOVUE-300) INJECTION 61% COMPARISON:  None. FINDINGS: CT CHEST FINDINGS Cardiovascular: Heart size within normal limits. No pericardial effusion. Central pulmonary artery is unremarkable. Atherosclerotic calcifications of thoracic aorta. Mediastinum/Nodes, lungs and pleura: There is a multilobulated mass in left hilum left upper lobe with central necrotic center. The mass measures at least 10.2 by 6.2 cm. The mass is best visualized in coronal image 42 extends about 11.4 cm cranial caudally by 7.3 cm transverse dimension. There is  complete obstruction of left upper lobe bronchus. Obstruction of lingular bronchus. There is loculated large left pleural effusion probable malignant. There is significant heterogeneous expansile lung parenchyma in apex and superior segment of the left upper lobe. Findings highly suspicious for alveolar tumor extension. Entrapped lung parenchyma with proteinaceous or hemorrhagic alveolar products cannot be excluded as is expansile appearance of the major fissure. Similar appearance is noted just inferior to the tumor in lingula coronal image 38. There is partial atelectasis in left lower lobe. Findings highly suspicious for malignancy. Correlation with PET scan fluid analysis in pulmonology consult is recommended. No definite pleural thickening or pleural enhancement. Musculoskeletal: No destructive bony lesions are noted. Mild degenerative changes mid thoracic spine. Sagittal view of the sternum is unremarkable. No destructive rib lesions. CT ABDOMEN PELVIS FINDINGS Hepatobiliary: Enhanced liver shows mild intrahepatic biliary ductal dilatation. At least 2 cysts are noted in right hepatic lobe anteriorly the largest measures 8 mm. The patient is status postcholecystectomy. CBD measures 6 mm in diameter. Pancreas: Enhanced pancreas shows no focal mass or acute pancreatitis. Spleen: Enhanced spleen is normal. Adrenals/Urinary Tract: There is mild nodular thickening of right adrenal gland measures 8 mm. Mild thickening of left adrenal gland measures 9 mm. Metastatic disease cannot be excluded. Bilateral kidneys shows normal enhancement. No hydronephrosis or hydroureter. Delayed renal images shows bilateral renal symmetrical excretion. Stomach/Bowel: No small bowel obstruction. No pericecal inflammation. The terminal ileum is unremarkable. Normal appendix partially visualized in axial image 86. Colonic diverticula are noted sigmoid colon. There is redundant sigmoid colon. No evidence of acute diverticulitis. No  evidence of acute colitis. Vascular/Lymphatic: Atherosclerotic calcifications of abdominal aorta and iliac arteries. No aortic aneurysm. No adenopathy. Reproductive: The uterus is not identified. No adnexal mass. There is mild thickening of vulvar wall. Clinical correlation is necessary. Other: No ascites or free abdominal air. The urinary bladder is under distended grossly unremarkable. Musculoskeletal: There are degenerative changes lumbar spine. Disc space flattening at L2-L3 level with vacuum disc phenomenon. Degenerative changes pubic symphysis. No destructive bony lesions are noted. There is dextroscoliosis of thoracic spine. IMPRESSION: 1. There is a dominant mass in left hilum left upper lobe extending in lingula. The mass measures at least 11.4 x 10.2 by 7.3 cm. There is complete obstruction of the bronchus in left upper lobe and lingula. There is low-density central necrotic center. This is highly suspicious for malignancy such as bronchogenic carcinoma. Further evaluation with PET scan fluid analysis and pulmonology consult is recommended. No definite evidence of pleural enhancement to suggest pleural metastatic disease. No significant mediastinal adenopathy 2. There is expansile heterogeneous appearance of lung parenchyma in superior segment of left upper lobe and left apex. Findings suspicious for alveolar tumor spread or trapped lung with hemorrhagic or proteinaceous alveolar products. 3. There is loculated left pleural effusion. Partial atelectasis in left lower lobe. 4. The right lung is clear.  No pulmonary  edema. 5. Mild bilateral nodular thickening of adrenal glands. Metastatic disease cannot be excluded. 6. No evidence of hepatic metastatic disease. Status postcholecystectomy. 7. No hydronephrosis or hydroureter. 8. No small bowel obstruction. 9. Colonic diverticula are noted left colon and sigmoid colon. No evidence of acute diverticulitis. 10. Degenerative changes lumbar spine. Dextroscoliosis  of thoracic spine. 11. The uterus is not identified probable surgically absent. There is thickening of the wall of the vulva. Clinical correlation is necessary. Electronically Signed   By: Lahoma Crocker M.D.   On: 08/16/2016 17:24   Ct Abdomen Pelvis W Contrast  Result Date: 08/16/2016 CLINICAL DATA:  Possible metastatic disease EXAM: CT CHEST, ABDOMEN, AND PELVIS WITH CONTRAST TECHNIQUE: Multidetector CT imaging of the chest, abdomen and pelvis was performed following the standard protocol during bolus administration of intravenous contrast. CONTRAST:  71m ISOVUE-300 IOPAMIDOL (ISOVUE-300) INJECTION 61% COMPARISON:  None. FINDINGS: CT CHEST FINDINGS Cardiovascular: Heart size within normal limits. No pericardial effusion. Central pulmonary artery is unremarkable. Atherosclerotic calcifications of thoracic aorta. Mediastinum/Nodes, lungs and pleura: There is a multilobulated mass in left hilum left upper lobe with central necrotic center. The mass measures at least 10.2 by 6.2 cm. The mass is best visualized in coronal image 42 extends about 11.4 cm cranial caudally by 7.3 cm transverse dimension. There is complete obstruction of left upper lobe bronchus. Obstruction of lingular bronchus. There is loculated large left pleural effusion probable malignant. There is significant heterogeneous expansile lung parenchyma in apex and superior segment of the left upper lobe. Findings highly suspicious for alveolar tumor extension. Entrapped lung parenchyma with proteinaceous or hemorrhagic alveolar products cannot be excluded as is expansile appearance of the major fissure. Similar appearance is noted just inferior to the tumor in lingula coronal image 38. There is partial atelectasis in left lower lobe. Findings highly suspicious for malignancy. Correlation with PET scan fluid analysis in pulmonology consult is recommended. No definite pleural thickening or pleural enhancement. Musculoskeletal: No destructive bony  lesions are noted. Mild degenerative changes mid thoracic spine. Sagittal view of the sternum is unremarkable. No destructive rib lesions. CT ABDOMEN PELVIS FINDINGS Hepatobiliary: Enhanced liver shows mild intrahepatic biliary ductal dilatation. At least 2 cysts are noted in right hepatic lobe anteriorly the largest measures 8 mm. The patient is status postcholecystectomy. CBD measures 6 mm in diameter. Pancreas: Enhanced pancreas shows no focal mass or acute pancreatitis. Spleen: Enhanced spleen is normal. Adrenals/Urinary Tract: There is mild nodular thickening of right adrenal gland measures 8 mm. Mild thickening of left adrenal gland measures 9 mm. Metastatic disease cannot be excluded. Bilateral kidneys shows normal enhancement. No hydronephrosis or hydroureter. Delayed renal images shows bilateral renal symmetrical excretion. Stomach/Bowel: No small bowel obstruction. No pericecal inflammation. The terminal ileum is unremarkable. Normal appendix partially visualized in axial image 86. Colonic diverticula are noted sigmoid colon. There is redundant sigmoid colon. No evidence of acute diverticulitis. No evidence of acute colitis. Vascular/Lymphatic: Atherosclerotic calcifications of abdominal aorta and iliac arteries. No aortic aneurysm. No adenopathy. Reproductive: The uterus is not identified. No adnexal mass. There is mild thickening of vulvar wall. Clinical correlation is necessary. Other: No ascites or free abdominal air. The urinary bladder is under distended grossly unremarkable. Musculoskeletal: There are degenerative changes lumbar spine. Disc space flattening at L2-L3 level with vacuum disc phenomenon. Degenerative changes pubic symphysis. No destructive bony lesions are noted. There is dextroscoliosis of thoracic spine. IMPRESSION: 1. There is a dominant mass in left hilum left upper lobe extending in lingula. The  mass measures at least 11.4 x 10.2 by 7.3 cm. There is complete obstruction of the  bronchus in left upper lobe and lingula. There is low-density central necrotic center. This is highly suspicious for malignancy such as bronchogenic carcinoma. Further evaluation with PET scan fluid analysis and pulmonology consult is recommended. No definite evidence of pleural enhancement to suggest pleural metastatic disease. No significant mediastinal adenopathy 2. There is expansile heterogeneous appearance of lung parenchyma in superior segment of left upper lobe and left apex. Findings suspicious for alveolar tumor spread or trapped lung with hemorrhagic or proteinaceous alveolar products. 3. There is loculated left pleural effusion. Partial atelectasis in left lower lobe. 4. The right lung is clear.  No pulmonary edema. 5. Mild bilateral nodular thickening of adrenal glands. Metastatic disease cannot be excluded. 6. No evidence of hepatic metastatic disease. Status postcholecystectomy. 7. No hydronephrosis or hydroureter. 8. No small bowel obstruction. 9. Colonic diverticula are noted left colon and sigmoid colon. No evidence of acute diverticulitis. 10. Degenerative changes lumbar spine. Dextroscoliosis of thoracic spine. 11. The uterus is not identified probable surgically absent. There is thickening of the wall of the vulva. Clinical correlation is necessary. Electronically Signed   By: Lahoma Crocker M.D.   On: 08/16/2016 17:24   US Thoracentesis Asp Pleural Space W/img Guide  Result Date: 08/17/2016 INDICATION: Left lung mass and left pleural effusion. Request for diagnostic left thoracentesis. EXAM: ULTRASOUND GUIDED LEFT THORACENTESIS MEDICATIONS: None. COMPLICATIONS: None immediate. PROCEDURE: An ultrasound guided thoracentesis was thoroughly discussed with the patient and questions answered. The benefits, risks, alternatives and complications were also discussed. The patient understands and wishes to proceed with the procedure. Written consent was obtained. Ultrasound was performed to localize and  mark an adequate pocket of fluid in the left posterior chest. The area was then prepped and draped in the normal sterile fashion. 1% Lidocaine was used for local anesthesia. Under ultrasound guidance a 6 Fr Safe-T-Centesis catheter was introduced. Thoracentesis was performed. The catheter was removed and a dressing applied. FINDINGS: A total of approximately 875 mL of yellow fluid was removed. Samples were sent to the laboratory as requested by the clinical team. IMPRESSION: Successful ultrasound guided left thoracentesis yielding 875 mL of pleural fluid. Electronically Signed   By: Markus Daft M.D.   On: 08/17/2016 17:26    ASSESSMENT: Left lung mass  PLAN:    1.  Left lung mass: Although thoracentesis was negative for malignancy, this is highly suspicious for stage IV lung cancer with malignant pleural effusion. CT of the head was negative for metastatic disease. Hospice and comfort care were discussed with both the patient and her daughter who is a Marine scientist, but ultimately they decided to pursue a diagnosis. Will order a PET scan to complete the staging. Have ordered a repeat ultrasound and thoracentesis. Have also referred patient to pulmonology for consideration of bronchoscopy. CT-guided biopsy is also a possibility. Given patient's declining performance status, patient and her daughter understand the treatment will likely be difficult but still wish to pursue a diagnosis. Return to clinic in several weeks after her repeat diagnostic studies to discuss the results.  Approximately 30 minutes was spent in discussion of which greater than 50% was consultation.  Patient expressed understanding and was in agreement with this plan. She also understands that She can call clinic at any time with any questions, concerns, or complaints.   No matching staging information was found for the patient.  Lloyd Huger, MD   08/28/2016 9:04  AM     

## 2016-08-25 ENCOUNTER — Encounter: Payer: Self-pay | Admitting: Oncology

## 2016-08-25 ENCOUNTER — Inpatient Hospital Stay: Payer: Medicare Other | Attending: Oncology | Admitting: Oncology

## 2016-08-25 VITALS — BP 114/75 | HR 105 | Temp 97.5°F | Wt 92.0 lb

## 2016-08-25 DIAGNOSIS — E039 Hypothyroidism, unspecified: Secondary | ICD-10-CM | POA: Diagnosis not present

## 2016-08-25 DIAGNOSIS — J90 Pleural effusion, not elsewhere classified: Secondary | ICD-10-CM

## 2016-08-25 DIAGNOSIS — R918 Other nonspecific abnormal finding of lung field: Secondary | ICD-10-CM

## 2016-08-25 DIAGNOSIS — Z79899 Other long term (current) drug therapy: Secondary | ICD-10-CM | POA: Diagnosis not present

## 2016-08-25 DIAGNOSIS — Z87891 Personal history of nicotine dependence: Secondary | ICD-10-CM | POA: Insufficient documentation

## 2016-08-25 DIAGNOSIS — Z9049 Acquired absence of other specified parts of digestive tract: Secondary | ICD-10-CM | POA: Diagnosis not present

## 2016-08-26 ENCOUNTER — Ambulatory Visit: Payer: Medicare Other | Admitting: Internal Medicine

## 2016-08-28 ENCOUNTER — Telehealth: Payer: Self-pay | Admitting: *Deleted

## 2016-08-28 DIAGNOSIS — R918 Other nonspecific abnormal finding of lung field: Secondary | ICD-10-CM

## 2016-08-28 MED ORDER — MEGESTROL ACETATE 40 MG PO TABS: 40.0000 mg | ORAL_TABLET | Freq: Every day | ORAL | 1 refills | Status: AC

## 2016-08-28 MED ORDER — PROCHLORPERAZINE MALEATE 10 MG PO TABS: 10.0000 mg | ORAL_TABLET | Freq: Four times a day (QID) | ORAL | 1 refills | Status: DC | PRN

## 2016-08-28 NOTE — Telephone Encounter (Signed)
Pt requests prescription for megace due to decreased appetite. Also having issues with constipation and nausea. Pt was advised to start miralax with stool softener BID and to follow up with [place] on Monday if constipation has not resolved. Informed pt will send prescription for nausea to pharmacy.

## 2016-08-31 ENCOUNTER — Ambulatory Visit: Admission: RE | Admit: 2016-08-31 | Payer: Medicare Other | Source: Ambulatory Visit

## 2016-09-01 ENCOUNTER — Encounter
Admission: RE | Admit: 2016-09-01 | Discharge: 2016-09-01 | Disposition: A | Discharge status: Home / Self Care | Payer: Medicare Other | Source: Ambulatory Visit | Attending: Oncology | Admitting: Oncology

## 2016-09-01 ENCOUNTER — Ambulatory Visit: Payer: Medicare Other | Admitting: Internal Medicine

## 2016-09-01 ENCOUNTER — Ambulatory Visit: Payer: Medicare Other | Admitting: Oncology

## 2016-09-01 DIAGNOSIS — R918 Other nonspecific abnormal finding of lung field: Secondary | ICD-10-CM | POA: Insufficient documentation

## 2016-09-01 DIAGNOSIS — J90 Pleural effusion, not elsewhere classified: Secondary | ICD-10-CM | POA: Insufficient documentation

## 2016-09-01 LAB — GLUCOSE, CAPILLARY: Glucose-Capillary: 111 mg/dL — ABNORMAL HIGH (ref 65–99)

## 2016-09-01 MED ORDER — FLUDEOXYGLUCOSE F - 18 (FDG) INJECTION
12.2100 | Freq: Once | INTRAVENOUS | Status: AC | PRN
  Administered 2016-09-01: 12.21 via INTRAVENOUS

## 2016-09-09 ENCOUNTER — Telehealth: Payer: Self-pay | Admitting: *Deleted

## 2016-09-09 MED ORDER — HYDROCODONE-ACETAMINOPHEN 5-325 MG PO TABS
1.0000 | ORAL_TABLET | Freq: Four times a day (QID) | ORAL | 0 refills | Status: DC | PRN
Start: 2016-09-09 — End: 2016-09-23

## 2016-09-09 NOTE — Telephone Encounter (Signed)
Daughter called wanting PET results and medication management of pain. New medication ordered, with MD approval writer read results of PET to daughter. Advised daughter to keep appointment with pulmonology.

## 2016-09-23 ENCOUNTER — Other Ambulatory Visit: Payer: Self-pay | Admitting: *Deleted

## 2016-09-23 DIAGNOSIS — R918 Other nonspecific abnormal finding of lung field: Secondary | ICD-10-CM

## 2016-09-23 MED ORDER — HYDROCODONE-ACETAMINOPHEN 5-325 MG PO TABS: 1.0000 | ORAL_TABLET | Freq: Four times a day (QID) | ORAL | 0 refills | Status: AC | PRN

## 2016-09-23 MED ORDER — PROCHLORPERAZINE MALEATE 10 MG PO TABS: 10.0000 mg | ORAL_TABLET | Freq: Four times a day (QID) | ORAL | 1 refills | Status: AC | PRN

## 2016-09-24 ENCOUNTER — Ambulatory Visit: Payer: Medicare Other | Admitting: Internal Medicine

## 2016-09-25 ENCOUNTER — Telehealth: Payer: Self-pay | Admitting: Internal Medicine

## 2016-09-25 NOTE — Telephone Encounter (Signed)
Soke with daughter to r/s appt on 09/29/16 for Change in provider schedule.  Per daughter patient is not doing well.  She is in bed all the time lethargic and having a lot of pain.  Daughter is trying to keep from taking patient to the emergency room.  C/o weight loss from 92 lbs to current 83 lbs.  Daughter is tearful on the phone and says she feels like her mom is dying in front of her and she really needs to talk with someone.

## 2016-09-25 NOTE — Telephone Encounter (Signed)
Changed appt to 1145 same day .

## 2016-09-26 ENCOUNTER — Encounter: Payer: Self-pay | Admitting: Emergency Medicine

## 2016-09-26 ENCOUNTER — Emergency Department: Payer: Medicare Other

## 2016-09-26 ENCOUNTER — Inpatient Hospital Stay
Admission: EM | Admit: 2016-09-26 | Discharge: 2016-09-29 | DRG: 689 | Disposition: A | Discharge status: Hospice | Payer: Medicare Other | Attending: Internal Medicine | Admitting: Internal Medicine

## 2016-09-26 DIAGNOSIS — C3492 Malignant neoplasm of unspecified part of left bronchus or lung: Secondary | ICD-10-CM | POA: Diagnosis present

## 2016-09-26 DIAGNOSIS — J90 Pleural effusion, not elsewhere classified: Secondary | ICD-10-CM

## 2016-09-26 DIAGNOSIS — M79602 Pain in left arm: Secondary | ICD-10-CM | POA: Diagnosis present

## 2016-09-26 DIAGNOSIS — R531 Weakness: Secondary | ICD-10-CM

## 2016-09-26 DIAGNOSIS — R683 Clubbing of fingers: Secondary | ICD-10-CM | POA: Diagnosis not present

## 2016-09-26 DIAGNOSIS — E039 Hypothyroidism, unspecified: Secondary | ICD-10-CM | POA: Diagnosis present

## 2016-09-26 DIAGNOSIS — R627 Adult failure to thrive: Secondary | ICD-10-CM | POA: Diagnosis not present

## 2016-09-26 DIAGNOSIS — F0280 Dementia in other diseases classified elsewhere without behavioral disturbance: Secondary | ICD-10-CM | POA: Diagnosis present

## 2016-09-26 DIAGNOSIS — Z79899 Other long term (current) drug therapy: Secondary | ICD-10-CM | POA: Diagnosis not present

## 2016-09-26 DIAGNOSIS — E43 Unspecified severe protein-calorie malnutrition: Secondary | ICD-10-CM | POA: Diagnosis present

## 2016-09-26 DIAGNOSIS — Z7982 Long term (current) use of aspirin: Secondary | ICD-10-CM | POA: Diagnosis not present

## 2016-09-26 DIAGNOSIS — Z803 Family history of malignant neoplasm of breast: Secondary | ICD-10-CM

## 2016-09-26 DIAGNOSIS — E876 Hypokalemia: Secondary | ICD-10-CM | POA: Diagnosis present

## 2016-09-26 DIAGNOSIS — Z515 Encounter for palliative care: Secondary | ICD-10-CM

## 2016-09-26 DIAGNOSIS — R5381 Other malaise: Secondary | ICD-10-CM | POA: Diagnosis not present

## 2016-09-26 DIAGNOSIS — C3490 Malignant neoplasm of unspecified part of unspecified bronchus or lung: Secondary | ICD-10-CM

## 2016-09-26 DIAGNOSIS — K219 Gastro-esophageal reflux disease without esophagitis: Secondary | ICD-10-CM | POA: Diagnosis present

## 2016-09-26 DIAGNOSIS — Z808 Family history of malignant neoplasm of other organs or systems: Secondary | ICD-10-CM

## 2016-09-26 DIAGNOSIS — Z66 Do not resuscitate: Secondary | ICD-10-CM | POA: Diagnosis present

## 2016-09-26 DIAGNOSIS — N3001 Acute cystitis with hematuria: Secondary | ICD-10-CM | POA: Diagnosis present

## 2016-09-26 DIAGNOSIS — Z7189 Other specified counseling: Secondary | ICD-10-CM

## 2016-09-26 DIAGNOSIS — G9340 Encephalopathy, unspecified: Secondary | ICD-10-CM | POA: Diagnosis present

## 2016-09-26 DIAGNOSIS — M79601 Pain in right arm: Secondary | ICD-10-CM | POA: Diagnosis present

## 2016-09-26 DIAGNOSIS — R410 Disorientation, unspecified: Secondary | ICD-10-CM

## 2016-09-26 DIAGNOSIS — Z87891 Personal history of nicotine dependence: Secondary | ICD-10-CM | POA: Diagnosis not present

## 2016-09-26 DIAGNOSIS — N390 Urinary tract infection, site not specified: Secondary | ICD-10-CM

## 2016-09-26 DIAGNOSIS — G309 Alzheimer's disease, unspecified: Secondary | ICD-10-CM | POA: Diagnosis present

## 2016-09-26 DIAGNOSIS — Z681 Body mass index (BMI) 19 or less, adult: Secondary | ICD-10-CM

## 2016-09-26 DIAGNOSIS — R918 Other nonspecific abnormal finding of lung field: Secondary | ICD-10-CM | POA: Diagnosis not present

## 2016-09-26 LAB — URINALYSIS, COMPLETE (UACMP) WITH MICROSCOPIC
Bacteria, UA: NONE SEEN
Bilirubin Urine: NEGATIVE
GLUCOSE, UA: NEGATIVE mg/dL
HGB URINE DIPSTICK: NEGATIVE
Ketones, ur: NEGATIVE mg/dL
NITRITE: NEGATIVE
Protein, ur: NEGATIVE mg/dL
SPECIFIC GRAVITY, URINE: 1.005 (ref 1.005–1.030)
pH: 6 (ref 5.0–8.0)

## 2016-09-26 LAB — HEPATIC FUNCTION PANEL
ALBUMIN: 3.4 g/dL — AB (ref 3.5–5.0)
ALK PHOS: 104 U/L (ref 38–126)
ALT: 10 U/L — ABNORMAL LOW (ref 14–54)
AST: 17 U/L (ref 15–41)
BILIRUBIN TOTAL: 0.5 mg/dL (ref 0.3–1.2)
Total Protein: 8.2 g/dL — ABNORMAL HIGH (ref 6.5–8.1)

## 2016-09-26 LAB — CBC
HCT: 34.2 % — ABNORMAL LOW (ref 35.0–47.0)
Hemoglobin: 11.3 g/dL — ABNORMAL LOW (ref 12.0–16.0)
MCH: 26 pg (ref 26.0–34.0)
MCHC: 33.1 g/dL (ref 32.0–36.0)
MCV: 78.6 fL — ABNORMAL LOW (ref 80.0–100.0)
Platelets: 630 10*3/uL — ABNORMAL HIGH (ref 150–440)
RBC: 4.36 MIL/uL (ref 3.80–5.20)
RDW: 15.9 % — AB (ref 11.5–14.5)
WBC: 11 10*3/uL (ref 3.6–11.0)

## 2016-09-26 LAB — BASIC METABOLIC PANEL
ANION GAP: 9 (ref 5–15)
BUN: 11 mg/dL (ref 6–20)
CALCIUM: 12 mg/dL — AB (ref 8.9–10.3)
CO2: 24 mmol/L (ref 22–32)
CREATININE: 0.79 mg/dL (ref 0.44–1.00)
Chloride: 102 mmol/L (ref 101–111)
GFR calc Af Amer: >60 mL/min (ref >60)
GLUCOSE: 92 mg/dL (ref 65–99)
Potassium: 3.2 mmol/L — ABNORMAL LOW (ref 3.5–5.1)
Sodium: 135 mmol/L (ref 135–145)

## 2016-09-26 LAB — TROPONIN I

## 2016-09-26 MED ORDER — CEFTRIAXONE SODIUM-DEXTROSE 1-3.74 GM-% IV SOLR
1.0000 g | Freq: Once | INTRAVENOUS | Status: AC
  Administered 2016-09-26: 1 g via INTRAVENOUS
  Filled 2016-09-26: qty 50

## 2016-09-26 MED ORDER — DEXTROSE 5 % IV SOLN: 1.0000 g | Freq: Once | INTRAVENOUS | Status: DC

## 2016-09-26 MED ORDER — MIRTAZAPINE 15 MG PO TABS
15.0000 mg | ORAL_TABLET | Freq: Every day | ORAL | Status: DC
  Administered 2016-09-26 – 2016-09-28 (×3): 15 mg via ORAL
  Filled 2016-09-26 (×3): qty 1

## 2016-09-26 MED ORDER — SODIUM CHLORIDE 0.9 % IV BOLUS (SEPSIS)
1000.0000 mL | Freq: Once | INTRAVENOUS | Status: AC
  Administered 2016-09-26: 1000 mL via INTRAVENOUS

## 2016-09-26 MED ORDER — POTASSIUM CHLORIDE 20 MEQ/15ML (10%) PO SOLN
40.0000 meq | Freq: Once | ORAL | Status: AC
  Administered 2016-09-26: 40 meq via ORAL
  Filled 2016-09-26: qty 30

## 2016-09-26 NOTE — ED Provider Notes (Signed)
[facility] Emergency Department Provider Note   ____________________________________________   First MD Initiated Contact with Patient 09/26/16 1841     (approximate)  I have reviewed the triage vital signs and the nursing notes.   HISTORY  Chief Complaint Weakness  EM caveat: The patient is confused, unable to recall her review of systems or history reliably  Patient's daughter and son-in-law provide history.  HPI Gabriela Jordan is a 81 y.o. female with a recent suspected diagnosis of cancer, namely of the lungs) kill region.  Daughter reports the patient has continued having tracing confusion, weakness, won't eat, lost about 10 pounds again, and doing very poorly at home. She suspicious that she might haveurinary tract infections or urinary very strong for about 2-3 days. She had increased confusion and weakness and fatigue.   Past Medical History:  Diagnosis Date  . Hypothyroid     Patient Active Problem List   Diagnosis Date Noted  . Mass of left lung 08/18/2016  . S/P thoracentesis 08/18/2016  . Failure to thrive in adult 08/18/2016  . Hyponatremia 08/18/2016  . Dehydration 08/18/2016  . Thrombocytosis ([place]) 08/18/2016  . Anemia 08/18/2016  . Hyperglycemia 08/18/2016  . Hypercalcemia 08/18/2016  . Pleural effusion 08/16/2016    Past Surgical History:  Procedure Laterality Date  . CHOLECYSTECTOMY    . THYROIDECTOMY    . TONSILLECTOMY      Prior to Admission medications   Medication Sig Start Date End Date Taking? Authorizing Provider  acetaminophen (TYLENOL) 500 MG tablet Take 500 mg by mouth every 6 (six) hours as needed.   Yes Historical Provider, MD  aspirin EC 81 MG tablet 81 mg.   Yes Historical Provider, MD  calcitonin, salmon, (MIACALCIN/FORTICAL) 200 UNIT/ACT nasal spray Place 1 spray into alternate nostrils daily. 08/19/16  Yes Theodoro Grist, MD  HYDROcodone-acetaminophen (NORCO/VICODIN) 5-325 MG tablet Take 1 tablet by  mouth every 6 (six) hours as needed for moderate pain. 09/23/16  Yes Cammie Sickle, MD  levothyroxine (SYNTHROID, LEVOTHROID) 50 MCG tablet Take 50 mcg by mouth daily before breakfast.   Yes Historical Provider, MD  megestrol (MEGACE) 40 MG tablet Take 1 tablet (40 mg total) by mouth daily. 08/28/16  Yes Lloyd Huger, MD  pantoprazole (PROTONIX) 40 MG tablet Take 1 tablet (40 mg total) by mouth 2 (two) times daily. 08/18/16  Yes Theodoro Grist, MD  prochlorperazine (COMPAZINE) 10 MG tablet Take 1 tablet (10 mg total) by mouth every 6 (six) hours as needed for nausea or vomiting. 09/23/16  Yes Cammie Sickle, MD  Red Yeast Rice 600 MG TABS Take by mouth.   Yes Historical Provider, MD  senna-docusate (SENOKOT-S) 8.6-50 MG tablet Take 1 tablet by mouth at bedtime as needed for mild constipation. 08/18/16  Yes Theodoro Grist, MD  traMADol (ULTRAM) 50 MG tablet Take 1 tablet (50 mg total) by mouth every 6 (six) hours as needed for moderate pain. 08/18/16  Yes Theodoro Grist, MD    Allergies Ezetimibe; Nsaids; Sulfa antibiotics; Codeine; and Donepezil  Family History  Problem Relation Age of Onset  . Breast cancer Daughter   . Bone cancer Daughter     Social History Social History  Substance Use Topics  . Smoking status: Former Research scientist (life sciences)  . Smokeless tobacco: Never Used  . Alcohol use No    Review of Systems Patient denies being in any pain.  ____________________________________________   PHYSICAL EXAM:  VITAL SIGNS: ED Triage Vitals  Enc Vitals Group  BP 09/26/16 1538 (!) 164/95     Pulse Rate 09/26/16 1538 97     Resp 09/26/16 1538 18     Temp 09/26/16 1538 97.8 F (36.6 C)     Temp Source 09/26/16 1538 Oral     SpO2 09/26/16 1538 100 %     Weight 09/26/16 1539 102 lb (46.3 kg)     Height 09/26/16 1539 '5\' 4"'$  (1.626 m)     Head Circumference --      Peak Flow --      Pain Score --      Pain Loc --      Pain Edu? --      Excl. in [place]? --     Constitutional:  Alert and oriented. Well appearing and in no acute distress. Eyes: Conjunctivae are normal. PERRL. EOMI. Head: Atraumatic. Nose: No congestion/rhinnorhea. Mouth/Throat: Mucous membranes are moist.  Oropharynx non-erythematous. Neck: No stridor.   Cardiovascular: Normal rate, regular rhythm. Grossly normal heart sounds.  Good peripheral circulation. Respiratory: Normal respiratory effort.  No retractions. Lungs CTAB. Gastrointestinal: Soft and nontender. No distention. No abdominal bruits. No CVA tenderness. Musculoskeletal: No lower extremity tenderness nor edema.  No joint effusions. Neurologic:  Normal speech and language. No gross focal neurologic deficits are appreciated. No gait instability. Skin:  Skin is warm, dry and intact. No rash noted. Psychiatric: Mood and affect are normal. Speech and behavior are normal.  ____________________________________________   LABS (all labs ordered are listed, but only abnormal results are displayed)  Labs Reviewed  BASIC METABOLIC PANEL - Abnormal; Notable for the following:       Result Value   Potassium 3.2 (*)    Calcium 12.0 (*)    All other components within normal limits  CBC - Abnormal; Notable for the following:    Hemoglobin 11.3 (*)    HCT 34.2 (*)    MCV 78.6 (*)    RDW 15.9 (*)    Platelets 630 (*)    All other components within normal limits  URINALYSIS, COMPLETE (UACMP) WITH MICROSCOPIC - Abnormal; Notable for the following:    Color, Urine STRAW (*)    APPearance CLEAR (*)    Leukocytes, UA MODERATE (*)    Squamous Epithelial / LPF 0-5 (*)    All other components within normal limits  HEPATIC FUNCTION PANEL - Abnormal; Notable for the following:    Total Protein 8.2 (*)    Albumin 3.4 (*)    ALT 10 (*)    Bilirubin, Direct <0.1 (*)    All other components within normal limits  TROPONIN I  LACTIC ACID, PLASMA  LACTIC ACID, PLASMA  CBC  CREATININE, SERUM  BASIC METABOLIC PANEL  CBC    ____________________________________________  EKG  Reviewed and interpreted by me at 1610 Heart rate 100 QRS 90 QTc 540 Normal sinus rhythm, no acute ischemic changes noted ____________________________________________  RADIOLOGY  Ct Head Wo Contrast  Result Date: 09/26/2016 CLINICAL DATA:  Confusion EXAM: CT HEAD WITHOUT CONTRAST TECHNIQUE: Contiguous axial images were obtained from the base of the skull through the vertex without intravenous contrast. COMPARISON:  August 16, 2016 FINDINGS: Brain: There is mild diffuse atrophy, stable. There is no intracranial mass, hemorrhage, extra-axial fluid collection, or midline shift. There is patchy small vessel disease in the centra semiovale bilaterally. Elsewhere gray-white compartments are normal. No acute infarct evident. Vascular: There is no hyperdense vessel. There is calcification in each carotid siphon region. Skull: The bony calvarium appears intact. Sinuses/Orbits: Visualized  paranasal sinuses are clear. Orbits appear symmetric bilaterally. Other: Mastoid air cells are clear. IMPRESSION: Atrophy with patchy periventricular small vessel disease. No intracranial mass, hemorrhage, or extra-axial fluid collection. No acute infarct. Mild vascular calcification. Electronically Signed   By: Lowella Grip III M.D.   On: 09/26/2016 21:08   Dg Chest Portable 1 View  Result Date: 09/26/2016 CLINICAL DATA:  81 year old with left upper lung mass (no diagnosis as of yet) and left pleural effusion, presenting with acute mental status changes which began earlier today. Former smoker. EXAM: PORTABLE CHEST 1 VIEW COMPARISON:  PET-CT 09/01/2016. Chest x-rays 08/17/2016, 08/16/2016. CT chest 08/16/2016. FINDINGS: Large left upper lobe lung mass and associated large left pleural effusion, not changed since the PET-CT 09/01/2016. There is volume loss in the left lung as the mediastinum is shifted to the left. Right lung remains clear. No right pleural  effusion. IMPRESSION: Large left upper lobe lung mass and associated large left pleural effusion, stable since the PET-CT of 09/01/2016. No new/acute cardiopulmonary disease. Electronically Signed   By: Evangeline Dakin M.D.   On: 09/26/2016 19:41    ____________________________________________   PROCEDURES  Procedure(s) performed: None  Procedures  Critical Care performed: No  ____________________________________________   INITIAL IMPRESSION / ASSESSMENT AND PLAN / ED COURSE  Pertinent labs & imaging results that were available during my care of the patient were reviewed by me and considered in my medical decision making (see chart for details).  Patient sure increasing weakness, poor appetite almost a failure to thrive type presentation with a possible urinary tract infection. No acute neurologic deficit but demonstrates confusion, occasional picking, attempting get out of bed and appears acutely delirious.  Discussed with the patient is family, particularly daughter and given goals of care and the patient's rapid worsening patient will be admitted to the hospital, IV antibiotics for your tract infection.   Clinical Course      ____________________________________________   FINAL CLINICAL IMPRESSION(S) / ED DIAGNOSES  Final diagnoses:  Weakness  Confusion  Urinary tract infection, acute  Hypokalemia  Failure to thrive in adult      NEW MEDICATIONS STARTED DURING THIS VISIT:  Current Discharge Medication List       Note:  This document was prepared using Dragon voice recognition software and may include unintentional dictation errors.     Delman Kitten, MD 09/27/16 [phone]

## 2016-09-26 NOTE — H&P (Signed)
History and Physical   [facility] - [place] [facility] Admission History and Physical [employer], D.O.    Patient Name: Gabriela Jordan MR#: [id number] Date of Birth: 1935/12/16 Date of Admission: 09/26/2016  Referring MD/NP/PA: Dr. Jacqualine Code Primary Care Physician: Dion Body, MD Outpatient Specialists: Dr. Grayland Ormond  Patient coming from: Home Please note the majority of the history has been obtained from the patient's daughter, EDP, PCP/Onc notes.  Patient is a poor historian secondary to altered mental status.   Chief Complaint: Lethargy and AMS  HPI: Gabriela Jordan is a 81 y.o. female with a known history of hypothyroidism, recently diagnosed lung cancer (presumed based on PET, no tissue dx) presents to the emergency department for evaluation of lethargy.   Patient was brought in to the ED by her daughter who is her primary caregiver (visiting long term from [place]).  She reports recent progressive weakness, lethargy, decreased PO intake, weight loss, deconditioning.  She brought her today because she was intermittently confused and had a foul odor about her.    Patient herself has no complaints.  States that she feels fine, wants to go home.  She denies fevers/chills, weakness, dizziness, chest pain, shortness of breath, N/V/C/D, abdominal pain, dysuria/frequency, changes in mental status.   Daughter states that her mother has been undergoing a workup for lung mass since discovering it at an ED visit last month.  She has had a PET scan, but no biopsy as of yet.  She was to have an oncology follow-up to develop a treatment plan this week but the appointment as cancelled because of snow.  Daughter is overwhelmed by caring for her mother as she has increasing needs and is becoming less independent.  Patient lives at home with her husband, who is on hospice, and her daughter, who is a Marine scientist.  Daughter would like for her mother to be placed for subacute rehab for reconditioning so  that she can come home and pursue treatment options for her lung mass.     Otherwise there has been no change in status. Patient has been taking medication as prescribed and there has been no recent change in medication or diet.  There has been no recent illness, travel or sick contacts.    ED Course: Patient rec'd Rocephin, Remeron, KCl, IVNS.  Review of Systems: Please note patient is a poor historian.  CONSTITUTIONAL: No fever/chills, fatigue, weakness, weight gain/loss, headache. EYES: No blurry or double vision. ENT: No tinnitus, postnasal drip, redness or soreness of the oropharynx. RESPIRATORY: No cough, dyspnea, wheeze, hemoptysis.  CARDIOVASCULAR: No chest pain, palpitations, syncope, orthopnea,  GASTROINTESTINAL: No nausea, vomiting, abdominal pain, constipation, diarrhea.  No hematemesis, melena or hematochezia. GENITOURINARY: No dysuria, frequency, hematuria. ENDOCRINE: No polyuria or nocturia. No heat or cold intolerance. HEMATOLOGY: No anemia, bruising, bleeding. INTEGUMENTARY: No rashes, ulcers, lesions. MUSCULOSKELETAL: No arthritis, gout, dyspnea.  NEUROLOGIC: No numbness, tingling, ataxia, seizure-type activity, weakness. PSYCHIATRIC: No anxiety, depression, insomnia.   Past Medical History:  Diagnosis Date  . Hypothyroid     Past Surgical History:  Procedure Laterality Date  . CHOLECYSTECTOMY    . THYROIDECTOMY    . TONSILLECTOMY       reports that she has quit smoking. She has never used smokeless tobacco. She reports that she does not drink alcohol or use drugs.  Allergies  Allergen Reactions  . Ezetimibe Other (See Comments)    Severe myalgias  . Nsaids Hives  . Sulfa Antibiotics Other (See Comments)    Caused  hot flashes  . Codeine     Codeine derivatives  . Donepezil Other (See Comments) and Nausea And Vomiting    Caused her to be "deathly sick"- vomiting    Family History  Problem Relation Age of Onset  . Breast cancer Daughter   . Bone  cancer Daughter     Prior to Admission medications   Medication Sig Start Date End Date Taking? Authorizing Provider  acetaminophen (TYLENOL) 500 MG tablet Take 500 mg by mouth every 6 (six) hours as needed.    Historical Provider, MD  aspirin EC 81 MG tablet 81 mg.    Historical Provider, MD  calcitonin, salmon, (MIACALCIN/FORTICAL) 200 UNIT/ACT nasal spray Place 1 spray into alternate nostrils daily. 08/19/16   Theodoro Grist, MD  HYDROcodone-acetaminophen (NORCO/VICODIN) 5-325 MG tablet Take 1 tablet by mouth every 6 (six) hours as needed for moderate pain. 09/23/16   Cammie Sickle, MD  levofloxacin (LEVAQUIN) 750 MG tablet Take 1 tablet (750 mg total) by mouth daily. Patient not taking: Reported on 08/25/2016 08/18/16   Theodoro Grist, MD  levothyroxine (SYNTHROID, LEVOTHROID) 50 MCG tablet Take 50 mcg by mouth daily before breakfast.    Historical Provider, MD  megestrol (MEGACE) 40 MG tablet Take 1 tablet (40 mg total) by mouth daily. 08/28/16   Lloyd Huger, MD  mirtazapine (REMERON) 15 MG tablet Take 15 mg by mouth at bedtime.    Historical Provider, MD  pantoprazole (PROTONIX) 40 MG tablet Take 1 tablet (40 mg total) by mouth 2 (two) times daily. 08/18/16   Theodoro Grist, MD  prochlorperazine (COMPAZINE) 10 MG tablet Take 1 tablet (10 mg total) by mouth every 6 (six) hours as needed for nausea or vomiting. 09/23/16   Cammie Sickle, MD  Red Yeast Rice 600 MG TABS Take by mouth.    Historical Provider, MD  senna-docusate (SENOKOT-S) 8.6-50 MG tablet Take 1 tablet by mouth at bedtime as needed for mild constipation. 08/18/16   Theodoro Grist, MD  traMADol (ULTRAM) 50 MG tablet Take 1 tablet (50 mg total) by mouth every 6 (six) hours as needed for moderate pain. 08/18/16   Theodoro Grist, MD    Physical Exam: Vitals:   09/26/16 1538 09/26/16 1539 09/26/16 1930 09/26/16 2000  BP: (!) 164/95  (!) 167/96 (!) 174/103  Pulse: 97   94  Resp: 18     Temp: 97.8 F (36.6 C)      TempSrc: Oral     SpO2: 100%   96%  Weight:  46.3 kg (102 lb)    Height:  '5\' 4"'$  (1.626 m)      GENERAL: 81 y.o.-year-old frail white female patient, well-developed, well-nourished lying in the bed in no acute distress.  Pleasantly confused, mildly agitated.  Global weakness HEENT: Head atraumatic, normocephalic. Pupils equal, round, reactive to light and accommodation. No scleral icterus. Extraocular muscles intact. Nares are patent. Oropharynx is clear. Mucus membranes moist. NECK: Supple, full range of motion. No JVD, no bruit heard. No thyroid enlargement, no tenderness, no cervical lymphadenopathy. CHEST: Normal breath sounds bilaterally. No wheezing, rales, rhonchi or crackles. No use of accessory muscles of respiration.  No reproducible chest wall tenderness.  CARDIOVASCULAR: S1, S2 normal. No murmurs, rubs, or gallops. Cap refill <2 seconds. Pulses intact distally.  ABDOMEN: Soft, nondistended, nontender, . No rebound, guarding, rigidity. Normoactive bowel sounds present in all four quadrants. No organomegaly or mass. EXTREMITIES: No pedal edema, cyanosis, or clubbing. NEUROLOGIC: Cranial nerves II through XII are grossly  intact with no focal sensorimotor deficit. Muscle strength 5/5 in all extremities. Sensation intact. Gait not checked. PSYCHIATRIC: The patient is alert and oriented x 2.  SKIN: Warm, dry, and intact without obvious rash, lesion, or ulcer.   Labs on Admission: I have personally reviewed following labs and imaging studies  CBC:  Recent Labs Lab 09/26/16 1550  WBC 11.0  HGB 11.3*  HCT 34.2*  MCV 78.6*  PLT 580*   Basic Metabolic Panel:  Recent Labs Lab 09/26/16 1550  NA 135  K 3.2*  CL 102  CO2 24  GLUCOSE 92  BUN 11  CREATININE 0.79  CALCIUM 12.0*   GFR: Estimated Creatinine Clearance: 42.4 mL/min (by C-G formula based on SCr of 0.79 mg/dL). Liver Function Tests:  Recent Labs Lab 09/26/16 1550  AST 17  ALT 10*  ALKPHOS 104  BILITOT 0.5   PROT 8.2*  ALBUMIN 3.4*   No results for input(s): LIPASE, AMYLASE in the last 168 hours. No results for input(s): AMMONIA in the last 168 hours. Coagulation Profile: No results for input(s): INR, PROTIME in the last 168 hours. Cardiac Enzymes:  Recent Labs Lab 09/26/16 1550  TROPONINI <0.03   BNP (last 3 results) No results for input(s): PROBNP in the last 8760 hours. HbA1C: No results for input(s): HGBA1C in the last 72 hours. CBG: No results for input(s): GLUCAP in the last 168 hours. Lipid Profile: No results for input(s): CHOL, HDL, LDLCALC, TRIG, CHOLHDL, LDLDIRECT in the last 72 hours. Thyroid Function Tests: No results for input(s): TSH, T4TOTAL, FREET4, T3FREE, THYROIDAB in the last 72 hours. Anemia Panel: No results for input(s): VITAMINB12, FOLATE, FERRITIN, TIBC, IRON, RETICCTPCT in the last 72 hours. Urine analysis:    Component Value Date/Time   COLORURINE STRAW (A) 09/26/2016 1350   APPEARANCEUR CLEAR (A) 09/26/2016 1350   LABSPEC 1.005 09/26/2016 1350   PHURINE 6.0 09/26/2016 1350   GLUCOSEU NEGATIVE 09/26/2016 1350   HGBUR NEGATIVE 09/26/2016 1350   BILIRUBINUR NEGATIVE 09/26/2016 1350   KETONESUR NEGATIVE 09/26/2016 1350   PROTEINUR NEGATIVE 09/26/2016 1350   NITRITE NEGATIVE 09/26/2016 1350   LEUKOCYTESUR MODERATE (A) 09/26/2016 1350   Sepsis Labs: '@LABRCNTIP'$ (procalcitonin:4,lacticidven:4) )No results found for this or any previous visit (from the past 240 hour(s)).   Radiological Exams on Admission: Ct Head Wo Contrast  Result Date: 09/26/2016 CLINICAL DATA:  Confusion EXAM: CT HEAD WITHOUT CONTRAST TECHNIQUE: Contiguous axial images were obtained from the base of the skull through the vertex without intravenous contrast. COMPARISON:  August 16, 2016 FINDINGS: Brain: There is mild diffuse atrophy, stable. There is no intracranial mass, hemorrhage, extra-axial fluid collection, or midline shift. There is patchy small vessel disease in the centra  semiovale bilaterally. Elsewhere gray-white compartments are normal. No acute infarct evident. Vascular: There is no hyperdense vessel. There is calcification in each carotid siphon region. Skull: The bony calvarium appears intact. Sinuses/Orbits: Visualized paranasal sinuses are clear. Orbits appear symmetric bilaterally. Other: Mastoid air cells are clear. IMPRESSION: Atrophy with patchy periventricular small vessel disease. No intracranial mass, hemorrhage, or extra-axial fluid collection. No acute infarct. Mild vascular calcification. Electronically Signed   By: Lowella Grip III M.D.   On: 09/26/2016 21:08   Dg Chest Portable 1 View  Result Date: 09/26/2016 CLINICAL DATA:  81 year old with left upper lung mass (no diagnosis as of yet) and left pleural effusion, presenting with acute mental status changes which began earlier today. Former smoker. EXAM: PORTABLE CHEST 1 VIEW COMPARISON:  PET-CT 09/01/2016. Chest x-rays 08/17/2016,  08/16/2016. CT chest 08/16/2016. FINDINGS: Large left upper lobe lung mass and associated large left pleural effusion, not changed since the PET-CT 09/01/2016. There is volume loss in the left lung as the mediastinum is shifted to the left. Right lung remains clear. No right pleural effusion. IMPRESSION: Large left upper lobe lung mass and associated large left pleural effusion, stable since the PET-CT of 09/01/2016. No new/acute cardiopulmonary disease. Electronically Signed   By: Evangeline Dakin M.D.   On: 09/26/2016 19:41    EKG: Normal sinus rhythm at 100 bpm with normal axis and nonspecific ST-T wave changes.   Assessment/Plan Active Problems:   Failure to thrive in adult    This is a 81 y.o. female with a history of hypothyroidism, recently diagnosed lung mass ? malignancy now being admitted with: 1. Failure to thrive in an adult - Admit to inpatient - PT, SW for consideration of rehab placement - Nutrition consult for weight loss, continue Megace -  Consider addition of antidepressant.   2. UTI - IVFs and IV Rocephin started in ED, continue  - Follow up urine cultures  3. Hypokalemia, mild - Replaced by mouth in ED - Recheck BMP in AM  4. Hypothyroid - Continue Synthroid  5. GERD - Continue Protonix  6. OA, chronic pain  - Continue tramadol, Norco  Admission status: Inpatient IV Fluids: NS Diet/Nutrition: Heart healthy Consults called: PT, SW, nutrition  DVT Px: Lovenox, SCDs and early ambulation Code Status: Full Code  Disposition Plan: To SAR vs home with PT in 2-3 days   All the records are reviewed and case discussed with ED provider. Management plans discussed with the patient and/or family who express understanding and agree with plan of care.  Levante Simones D.O. on 09/26/2016 at 11:34 PM Between 7am to 6pm - Pager - [phone] After 6pm go to [email or web address] - Proofreader [facility] [place] [phone] CC: Primary care physician; Dion Body, MD   09/26/2016, 11:34 PM

## 2016-09-26 NOTE — ED Triage Notes (Signed)
When patient is questioned why she is here states because her daughter sent her here. Originally states hurt her R wrist but no visible injury and on exam states that it does not hurt. When questioned if she is weaker than usual states yes. States lives with her husband.

## 2016-09-26 NOTE — ED Notes (Signed)
Pt doesn't want bp cuff on. Taken off to try and calm her.

## 2016-09-27 DIAGNOSIS — R918 Other nonspecific abnormal finding of lung field: Secondary | ICD-10-CM

## 2016-09-27 DIAGNOSIS — R627 Adult failure to thrive: Secondary | ICD-10-CM

## 2016-09-27 DIAGNOSIS — R5381 Other malaise: Secondary | ICD-10-CM

## 2016-09-27 DIAGNOSIS — R683 Clubbing of fingers: Secondary | ICD-10-CM

## 2016-09-27 LAB — CBC
HEMATOCRIT: 31.7 % — AB (ref 35.0–47.0)
Hemoglobin: 10.8 g/dL — ABNORMAL LOW (ref 12.0–16.0)
MCH: 26.7 pg (ref 26.0–34.0)
MCHC: 34.1 g/dL (ref 32.0–36.0)
MCV: 78.2 fL — ABNORMAL LOW (ref 80.0–100.0)
PLATELETS: 654 10*3/uL — AB (ref 150–440)
RBC: 4.06 MIL/uL (ref 3.80–5.20)
RDW: 16 % — AB (ref 11.5–14.5)
WBC: 10.6 10*3/uL (ref 3.6–11.0)

## 2016-09-27 LAB — BASIC METABOLIC PANEL
ANION GAP: 7 (ref 5–15)
BUN: 10 mg/dL (ref 6–20)
CO2: 26 mmol/L (ref 22–32)
Calcium: 11.7 mg/dL — ABNORMAL HIGH (ref 8.9–10.3)
Chloride: 105 mmol/L (ref 101–111)
Creatinine, Ser: 0.63 mg/dL (ref 0.44–1.00)
Glucose, Bld: 109 mg/dL — ABNORMAL HIGH (ref 65–99)
POTASSIUM: 3.3 mmol/L — AB (ref 3.5–5.1)
SODIUM: 138 mmol/L (ref 135–145)

## 2016-09-27 LAB — LACTIC ACID, PLASMA
Lactic Acid, Venous: 1.1 mmol/L (ref 0.5–1.9)
Lactic Acid, Venous: 1.1 mmol/L (ref 0.5–1.9)

## 2016-09-27 MED ORDER — ONDANSETRON HCL 4 MG/2ML IJ SOLN: 4.0000 mg | Freq: Four times a day (QID) | INTRAMUSCULAR | Status: DC | PRN

## 2016-09-27 MED ORDER — PROCHLORPERAZINE MALEATE 10 MG PO TABS
10.0000 mg | ORAL_TABLET | Freq: Four times a day (QID) | ORAL | Status: DC | PRN
  Filled 2016-09-27: qty 1

## 2016-09-27 MED ORDER — MAGNESIUM CITRATE PO SOLN: 1.0000 | Freq: Once | ORAL | Status: DC | PRN

## 2016-09-27 MED ORDER — MEGESTROL ACETATE 40 MG PO TABS
40.0000 mg | ORAL_TABLET | Freq: Every day | ORAL | Status: DC
  Administered 2016-09-27 – 2016-09-28 (×2): 40 mg via ORAL
  Filled 2016-09-27 (×2): qty 1

## 2016-09-27 MED ORDER — ACETAMINOPHEN 650 MG RE SUPP: 650.0000 mg | Freq: Four times a day (QID) | RECTAL | Status: DC | PRN

## 2016-09-27 MED ORDER — POTASSIUM CHLORIDE CRYS ER 20 MEQ PO TBCR
40.0000 meq | EXTENDED_RELEASE_TABLET | Freq: Once | ORAL | Status: AC
  Administered 2016-09-27: 09:00:00 40 meq via ORAL
  Filled 2016-09-27: qty 2

## 2016-09-27 MED ORDER — ONDANSETRON HCL 4 MG PO TABS: 4.0000 mg | ORAL_TABLET | Freq: Four times a day (QID) | ORAL | Status: DC | PRN

## 2016-09-27 MED ORDER — LEVOTHYROXINE SODIUM 50 MCG PO TABS
50.0000 ug | ORAL_TABLET | Freq: Every day | ORAL | Status: DC
  Administered 2016-09-27 – 2016-09-29 (×3): 50 ug via ORAL
  Filled 2016-09-27 (×3): qty 1

## 2016-09-27 MED ORDER — CEFTRIAXONE SODIUM-DEXTROSE 1-3.74 GM-% IV SOLR
1.0000 g | INTRAVENOUS | Status: DC
  Administered 2016-09-27 – 2016-09-28 (×2): 1 g via INTRAVENOUS
  Filled 2016-09-27 (×2): qty 50

## 2016-09-27 MED ORDER — ASPIRIN EC 81 MG PO TBEC
81.0000 mg | DELAYED_RELEASE_TABLET | Freq: Every day | ORAL | Status: DC
  Administered 2016-09-27: 81 mg via ORAL
  Filled 2016-09-27: qty 1

## 2016-09-27 MED ORDER — BISACODYL 5 MG PO TBEC: 5.0000 mg | DELAYED_RELEASE_TABLET | Freq: Every day | ORAL | Status: DC | PRN

## 2016-09-27 MED ORDER — ENOXAPARIN SODIUM 30 MG/0.3ML ~~LOC~~ SOLN: 30.0000 mg | Freq: Every day | SUBCUTANEOUS | Status: DC

## 2016-09-27 MED ORDER — PANTOPRAZOLE SODIUM 40 MG PO TBEC
40.0000 mg | DELAYED_RELEASE_TABLET | Freq: Two times a day (BID) | ORAL | Status: DC
  Administered 2016-09-27 – 2016-09-29 (×5): 40 mg via ORAL
  Filled 2016-09-27 (×5): qty 1

## 2016-09-27 MED ORDER — ENSURE ENLIVE PO LIQD
237.0000 mL | Freq: Two times a day (BID) | ORAL | Status: DC
  Administered 2016-09-28: 237 mL via ORAL

## 2016-09-27 MED ORDER — CALCITONIN (SALMON) 200 UNIT/ACT NA SOLN
1.0000 | Freq: Every day | NASAL | Status: DC
  Administered 2016-09-27 – 2016-09-29 (×3): 1 via NASAL
  Filled 2016-09-27: qty 3.7

## 2016-09-27 MED ORDER — HYDROCODONE-ACETAMINOPHEN 5-325 MG PO TABS
1.0000 | ORAL_TABLET | Freq: Four times a day (QID) | ORAL | Status: DC | PRN
  Administered 2016-09-27: 1 via ORAL
  Filled 2016-09-27: qty 1

## 2016-09-27 MED ORDER — TRAMADOL HCL 50 MG PO TABS: 50.0000 mg | ORAL_TABLET | Freq: Four times a day (QID) | ORAL | Status: DC | PRN

## 2016-09-27 MED ORDER — SODIUM CHLORIDE 0.9 % IV SOLN
INTRAVENOUS | Status: DC
  Administered 2016-09-27 – 2016-09-29 (×5): via INTRAVENOUS

## 2016-09-27 MED ORDER — ZOLEDRONIC ACID 4 MG/5ML IV CONC
4.0000 mg | Freq: Once | INTRAVENOUS | Status: AC
  Administered 2016-09-27: 4 mg via INTRAVENOUS
  Filled 2016-09-27: qty 5

## 2016-09-27 MED ORDER — SENNOSIDES-DOCUSATE SODIUM 8.6-50 MG PO TABS
1.0000 | ORAL_TABLET | Freq: Every evening | ORAL | Status: DC | PRN
Start: 2016-09-27 — End: 2016-09-29
  Administered 2016-09-27: 1 via ORAL
  Filled 2016-09-27: qty 1

## 2016-09-27 MED ORDER — ACETAMINOPHEN 325 MG PO TABS
650.0000 mg | ORAL_TABLET | Freq: Four times a day (QID) | ORAL | Status: DC | PRN
  Administered 2016-09-27: 650 mg via ORAL
  Filled 2016-09-27: qty 2

## 2016-09-27 MED ORDER — ALBUTEROL SULFATE (2.5 MG/3ML) 0.083% IN NEBU: 2.5000 mg | INHALATION_SOLUTION | Freq: Four times a day (QID) | RESPIRATORY_TRACT | Status: DC | PRN

## 2016-09-27 NOTE — Progress Notes (Signed)
Initial Nutrition Assessment  DOCUMENTATION CODES:   Severe malnutrition in context of chronic illness  INTERVENTION:  1. Ensure Enlive po BID, each supplement provides 350 kcal and 20 grams of protein  NUTRITION DIAGNOSIS:   Malnutrition related to chronic illness as evidenced by severe depletion of muscle mass, severe depletion of body fat.  GOAL:   Patient will meet greater than or equal to 90% of their needs  MONITOR:   PO intake, I & O's, Labs, Weight trends, Supplement acceptance  REASON FOR ASSESSMENT:   Malnutrition Screening Tool    ASSESSMENT:   Gabriela Jordan is a 81 y.o. female with a known history of hypothyroidism, recently diagnosed lung cancer (presumed based on PET, no tissue dx) presents to the emergency department for evaluation of lethargy.   Spoke with pt briefly - she provides mostly inappropriate answers to questions.. Questionable history. Per chart she exhibits a 9#/9.8% severe wt loss over 1 month. Documented PO thus far 0% Nutrition-Focused physical exam completed. Findings are severe fat depletion, severe muscle depletion, and no edema.   Labs and medications reviewed.  Diet Order:  Diet Heart Room service appropriate? Yes; Fluid consistency: Thin  Skin:  Reviewed, no issues  Last BM:  PTA  Height:   Ht Readings from Last 1 Encounters:  09/26/16 '5\' 4"'$  (1.626 m)    Weight:   Wt Readings from Last 1 Encounters:  09/27/16 83 lb 9.6 oz (37.9 kg)    Ideal Body Weight:  54.54 kg  BMI:  Body mass index is 14.35 kg/m.  Estimated Nutritional Needs:   Kcal:  1320-1509 calories (35-40 cal/kg)  Protein:  50-64 gm (1.3-1.7g/kg)  Fluid:  >/= 1.3L  EDUCATION NEEDS:   No education needs identified at this time  Satira Anis. Jaskirat Zertuche, MS, RD LDN Inpatient Clinical Dietitian Pager [phone]

## 2016-09-27 NOTE — Plan of Care (Signed)
Problem: Education: Goal: Knowledge of [facility] Education information/materials will improve Outcome: Not Progressing Pt alert to self only.   Problem: Safety: Goal: Ability to remain free from injury will improve Outcome: Progressing Low bed ordered. Floor mat in place.  Problem: Pain Managment: Goal: General experience of comfort will improve Outcome: Progressing No signs of pain or discomfort.

## 2016-09-27 NOTE — Consult Note (Signed)
[facility] Consultation      Assessment and Plan:  Left lung mass with left pleural effusion. --Strongly suspect malignancy. Given size of the mass, an abscess would likely give greater symptoms.  --Pt would be at higher risk of complications with bronchoscopy given severe weakness and advanced dementia.  --Will place order for repeat thoracentesis now and may be repeated x 1. If this is still unrevealing, and needle biopsy is still not an option, daughter would be amenable to hospice.   Clubbing.  --Likely secondary to above.   Deconditioning, dementia.  --Advanced, poor candidate for bronchoscopy.   Date: 09/27/2016  MRN# [id number] Gabriela Jordan Dec 02, 1935  Referring Physician:   BRISTAL STEFFY is a 81 y.o. old female seen in consultation for chief complaint of:    Chief Complaint  Patient presents with  . Weakness    HPI:  Patient is 81 yo female with a history of alzheimer's dementia, history is obtained from chart.  The patient was noted to have progresive FTT symptoms, she was brought to the hospital on 08/16/16 due to dizziness, she was noted on CT scan to have a large 11 cm mass with pleural effusion. She underwent thoracentesis on11/27/17; fluid was exudative, culture and cytology negative.  She was seen by Dr. Grayland Ormond on 11/27 and she was referred for further follow up and a PET scan which was completed on 12/12 which showed SUV max equal 20, concerning for cancer vs abscess.  She was referred to pulm clinic for assessment for possible bronchoscopy but this appt was cancelled due to weather. She was brought to the hospital again yesterday  because she was intermittently confused and had a foul odor about her. She was also noted to have progressive weakness, lethargy, decreased PO intake, weight loss, deconditioning.    Dr. Leslye Peer discussed the case with interventional radiology who felt that the mass was not amenable for diagnosis via needle biopsy. We  are consulted for diagnosis of the lung mass.  Currently on my assessment she is awake, alert, she can respond to some basic questions about how she is feeling. She is too weak to stand, at this time, she is awake but disoriented, she is on distress.   I discussed her case in detail with her daughter, [name], who is in town from [place] taking care of her father who is in hospice. She has noticed that her mothers condition has declined precipitously in the past 3 months. She also is a Marine scientist and realizes that her mother is likely coming to the end of her life. She has therefore decided to make her DNR.  I discussed with his that given her mother's severe debility and advanced dementia she would be at high risk of complication with bronchoscopy. Therefore I would recommend repeat thoracentesis x 2. If this is not helpful and needle biopsy is not an option then [name] would consider placing her in hospice.    PMHX:   Past Medical History:  Diagnosis Date  . Hypothyroid    Surgical Hx:  Past Surgical History:  Procedure Laterality Date  . CHOLECYSTECTOMY    . THYROIDECTOMY    . TONSILLECTOMY     Family Hx:  Family History  Problem Relation Age of Onset  . Breast cancer Daughter   . Bone cancer Daughter    Social Hx:   Social History  Substance Use Topics  . Smoking status: Former Research scientist (life sciences)  . Smokeless tobacco: Never Used  . Alcohol use  No   Medication:    reviewed.    Allergies:  Ezetimibe; Nsaids; Sulfa antibiotics; Codeine; and Donepezil  Review of Systems: Could not obtain due to dementia.   Physical Examination:   VS: BP (!) 168/99 (BP Location: Left Arm)   Pulse (!) 110   Temp 98 F (36.7 C) (Oral)   Resp 18   Ht '5\' 4"'$  (1.626 m)   Wt 83 lb 9.6 oz (37.9 kg)   SpO2 97%   BMI 14.35 kg/m   General Appearance: No distress  Neuro:without focal findings,  speech very weak.  HEENT: PERRLA, EOM intact.   Pulmonary: normal breath sounds, decreased air entry in left lung.   CardiovascularNormal S1,S2.  No m/r/g.   Abdomen: Benign, Soft, non-tender. Renal:  No costovertebral tenderness  GU:  No performed at this time. Endoc: No evident thyromegaly, no signs of acromegaly. Skin:   warm, no rashes, no ecchymosis  Extremities: weak, +clubbing.  Other findings:    LABORATORY PANEL:   CBC  Recent Labs Lab 09/27/16 0532  WBC 10.6  HGB 10.8*  HCT 31.7*  PLT 654*   ------------------------------------------------------------------------------------------------------------------  Chemistries   Recent Labs Lab 09/26/16 1550 09/27/16 0532  NA 135 138  K 3.2* 3.3*  CL 102 105  CO2 24 26  GLUCOSE 92 109*  BUN 11 10  CREATININE 0.79 0.63  CALCIUM 12.0* 11.7*  AST 17  --   ALT 10*  --   ALKPHOS 104  --   BILITOT 0.5  --    ------------------------------------------------------------------------------------------------------------------  Cardiac Enzymes  Recent Labs Lab 09/26/16 1550  TROPONINI <0.03   ------------------------------------------------------------  RADIOLOGY:  Ct Head Wo Contrast  Result Date: 09/26/2016 CLINICAL DATA:  Confusion EXAM: CT HEAD WITHOUT CONTRAST TECHNIQUE: Contiguous axial images were obtained from the base of the skull through the vertex without intravenous contrast. COMPARISON:  August 16, 2016 FINDINGS: Brain: There is mild diffuse atrophy, stable. There is no intracranial mass, hemorrhage, extra-axial fluid collection, or midline shift. There is patchy small vessel disease in the centra semiovale bilaterally. Elsewhere gray-white compartments are normal. No acute infarct evident. Vascular: There is no hyperdense vessel. There is calcification in each carotid siphon region. Skull: The bony calvarium appears intact. Sinuses/Orbits: Visualized paranasal sinuses are clear. Orbits appear symmetric bilaterally. Other: Mastoid air cells are clear. IMPRESSION: Atrophy with patchy periventricular small vessel disease.  No intracranial mass, hemorrhage, or extra-axial fluid collection. No acute infarct. Mild vascular calcification. Electronically Signed   By: Lowella Grip III M.D.   On: 09/26/2016 21:08   Dg Chest Portable 1 View  Result Date: 09/26/2016 CLINICAL DATA:  81 year old with left upper lung mass (no diagnosis as of yet) and left pleural effusion, presenting with acute mental status changes which began earlier today. Former smoker. EXAM: PORTABLE CHEST 1 VIEW COMPARISON:  PET-CT 09/01/2016. Chest x-rays 08/17/2016, 08/16/2016. CT chest 08/16/2016. FINDINGS: Large left upper lobe lung mass and associated large left pleural effusion, not changed since the PET-CT 09/01/2016. There is volume loss in the left lung as the mediastinum is shifted to the left. Right lung remains clear. No right pleural effusion. IMPRESSION: Large left upper lobe lung mass and associated large left pleural effusion, stable since the PET-CT of 09/01/2016. No new/acute cardiopulmonary disease. Electronically Signed   By: Evangeline Dakin M.D.   On: 09/26/2016 19:41       Thank  you for the consultation and for allowing [facility] to assist in the care of  your patient. Our recommendations are noted above.  Please contact us if we can be of further service.   Marda Stalker, MD.  Board Certified in Internal Medicine, Pulmonary Medicine, [place], and Sleep Medicine.  [facility] Office Number: [phone]  Patricia Pesa, M.D.  Vilinda Boehringer, M.D.  Merton Border, M.D  09/27/2016

## 2016-09-27 NOTE — Progress Notes (Signed)
Patient ID: Gabriela Jordan, female   DOB: [birth year], 81 y.o.   MRN: [id number]  [facility] PROGRESS NOTE  Gabriela Jordan SNK:[id number] DOB: 1936/08/01 DOA: 09/26/2016 PCP: Dion Body, MD  HPI/Subjective: Patient answers some yes or no questions. Not able to give much history. She states she has some arm pain in both her arms.  Objective: Vitals:   09/27/16 0516 09/27/16 0717  BP: (!) 167/78 (!) 168/99  Pulse: 100 (!) 110  Resp: 18 18  Temp: 97.7 F (36.5 C) 98 F (36.7 C)    Filed Weights   09/26/16 1539 09/27/16 0059  Weight: 46.3 kg (102 lb) 37.9 kg (83 lb 9.6 oz)    ROS: Review of Systems  Unable to perform ROS: Acuity of condition  Respiratory: Negative for cough and shortness of breath.   Cardiovascular: Negative for chest pain.  Gastrointestinal: Negative for abdominal pain, diarrhea, nausea and vomiting.  Musculoskeletal: Positive for joint pain and myalgias.   Exam: Physical Exam  HENT:  Nose: No mucosal edema.  Mouth/Throat: No oropharyngeal exudate or posterior oropharyngeal edema.  Eyes: Conjunctivae and lids are normal. Pupils are equal, round, and reactive to light.  Neck: No JVD present. Carotid bruit is not present. No edema present. No thyroid mass and no thyromegaly present.  Cardiovascular: Regular rhythm, S1 normal and S2 normal.  Tachycardia present.  Exam reveals no gallop.   No murmur heard. Pulses:      Dorsalis pedis pulses are 2+ on the right side, and 2+ on the left side.  Respiratory: No respiratory distress. She has no wheezes. She has no rhonchi. She has no rales.  GI: Soft. Bowel sounds are normal. There is no tenderness.  Musculoskeletal:       Right ankle: She exhibits no swelling.       Left ankle: She exhibits no swelling.  Lymphadenopathy:    She has no cervical adenopathy.  Neurological: She is alert.  Able to move all of her extremities on her own.  Skin: Skin is warm. No rash noted. Nails show no clubbing.   Psychiatric: Her affect is blunt.      Data Reviewed: Basic Metabolic Panel:  Recent Labs Lab 09/26/16 1550 09/27/16 0532  NA 135 138  K 3.2* 3.3*  CL 102 105  CO2 24 26  GLUCOSE 92 109*  BUN 11 10  CREATININE 0.79 0.63  CALCIUM 12.0* 11.7*   Liver Function Tests:  Recent Labs Lab 09/26/16 1550  AST 17  ALT 10*  ALKPHOS 104  BILITOT 0.5  PROT 8.2*  ALBUMIN 3.4*   CBC:  Recent Labs Lab 09/26/16 1550 09/27/16 0532  WBC 11.0 10.6  HGB 11.3* 10.8*  HCT 34.2* 31.7*  MCV 78.6* 78.2*  PLT 630* 654*   Cardiac Enzymes:  Recent Labs Lab 09/26/16 1550  TROPONINI <0.03     Studies: Ct Head Wo Contrast  Result Date: 09/26/2016 CLINICAL DATA:  Confusion EXAM: CT HEAD WITHOUT CONTRAST TECHNIQUE: Contiguous axial images were obtained from the base of the skull through the vertex without intravenous contrast. COMPARISON:  August 16, 2016 FINDINGS: Brain: There is mild diffuse atrophy, stable. There is no intracranial mass, hemorrhage, extra-axial fluid collection, or midline shift. There is patchy small vessel disease in the centra semiovale bilaterally. Elsewhere gray-white compartments are normal. No acute infarct evident. Vascular: There is no hyperdense vessel. There is calcification in each carotid siphon region. Skull: The bony calvarium appears intact. Sinuses/Orbits: Visualized paranasal sinuses are clear. Orbits appear  symmetric bilaterally. Other: Mastoid air cells are clear. IMPRESSION: Atrophy with patchy periventricular small vessel disease. No intracranial mass, hemorrhage, or extra-axial fluid collection. No acute infarct. Mild vascular calcification. Electronically Signed   By: Lowella Grip III M.D.   On: 09/26/2016 21:08   Dg Chest Portable 1 View  Result Date: 09/26/2016 CLINICAL DATA:  81 year old with left upper lung mass (no diagnosis as of yet) and left pleural effusion, presenting with acute mental status changes which began earlier today.  Former smoker. EXAM: PORTABLE CHEST 1 VIEW COMPARISON:  PET-CT 09/01/2016. Chest x-rays 08/17/2016, 08/16/2016. CT chest 08/16/2016. FINDINGS: Large left upper lobe lung mass and associated large left pleural effusion, not changed since the PET-CT 09/01/2016. There is volume loss in the left lung as the mediastinum is shifted to the left. Right lung remains clear. No right pleural effusion. IMPRESSION: Large left upper lobe lung mass and associated large left pleural effusion, stable since the PET-CT of 09/01/2016. No new/acute cardiopulmonary disease. Electronically Signed   By: Evangeline Dakin M.D.   On: 09/26/2016 19:41    Scheduled Meds: . calcitonin (salmon)  1 spray Alternating Nares Daily  . cefTRIAXone  1 g Intravenous Q24H  . levothyroxine  50 mcg Oral QAC breakfast  . megestrol  40 mg Oral Daily  . mirtazapine  15 mg Oral QHS  . pantoprazole  40 mg Oral BID AC   Continuous Infusions: . sodium chloride 75 mL/hr at 09/27/16 1419    Assessment/Plan:  1. Hypercalcemia of malignancy. Dose of Zometa given. Continue IV fluids. 2. Acute cystitis with hematuria with acute encephalopathy continue Rocephin and add on urine culture to urine already sent. 3. Large lung mass. Case discussed with interventional radiology and they felt that best be attempted by bronchoscopy. Case discussed with pulmonology Dr. Ashby Dawes to see the patient and set up for bronchoscopy. Aspirin held. Lovenox can be held if scheduling bronchoscopy soon. MRI of the brain to rule out brain metastases. 4. Failure to thrive and severe malnutrition on Megace. Encourage eating. 5. Weakness physical therapy consultation. 6. ACP discussion and patient made a DO NOT RESUSCITATE. Please see ACP note. 7. Hypothyroidism unspecified on levothyroxine  Code Status:     Code Status Orders        Start     Ordered   09/27/16 1000  Do not attempt resuscitation (DNR)  Continuous    Question Answer Comment  In the event of  cardiac or respiratory ARREST Do not call a "code blue"   In the event of cardiac or respiratory ARREST Do not perform Intubation, CPR, defibrillation or ACLS   In the event of cardiac or respiratory ARREST Use medication by any route, position, wound care, and other measures to relive pain and suffering. May use oxygen, suction and manual treatment of airway obstruction as needed for comfort.   Comments nurse to pronounce      09/27/16 1000    Code Status History    Date Active Date Inactive Code Status Order ID Comments User Context   09/27/2016  1:06 AM 09/27/2016 10:00 AM Full Code [id number]  Harvie Bridge, DO Inpatient   08/16/2016  8:37 PM 08/18/2016  7:23 PM Full Code [id number]  Nicholes Mango, MD Inpatient     Family Communication: Case discussed at length with daughter Disposition Plan: To be determined based on her strength  Consultants:  Pulmonary   Antibiotics:  Rocephin  Time spent: 28 minutes  [place], [place]

## 2016-09-27 NOTE — Progress Notes (Signed)
Pharmacist - Prescriber Communication  Enoxaparin has been changed to 30 mg subcutaeously once daily for low body weight.  Gabriela Jordan A. [place], [place].D., BCPS Clinical Pharmacist 09/28/2015 0111

## 2016-09-27 NOTE — Progress Notes (Signed)
Patient ID: Gabriela Jordan, female   DOB: 11-18-1935, 81 y.o.   MRN: [id number]  ACP discussion with daughter. Patient with altered mental status and unable to participate in this conversation  Patient with a large lung mass which is likely cancer. I discussed CODE STATUS and the patient was switched from a full code to a DO NOT RESUSCITATE. Family still unsure which way to go with things at this point and would like to know what this is prior to making any decisions. Patient also with hypercalcemia, failure to thrive, urinary tract infection  Time spent with ACP discussion 17 minutes

## 2016-09-27 NOTE — Evaluation (Signed)
Physical Therapy Evaluation Patient Details Name: Gabriela Jordan MRN: [id number] DOB: March 10, 1936 Today's Date: 09/27/2016   History of Present Illness  Pt is a 81 y/o F who has a recently diagnosed lung mass with no workup yet, and FTT with UTI, with recent weight loss of 10 lbs.  Living with daughter and her husband, but daughter overwhelmed.  PMHx:  PVD, gout, bladder CA,   Clinical Impression  Pt is walking with very specific assistance and needs to have close guard chair as she sits when ready.  Her acute therapy plan is to walk as able and work on LE strength via use as much as tolerated.    Follow Up Recommendations SNF    Equipment Recommendations  None recommended by PT    Recommendations for Other Services       Precautions / Restrictions Precautions Precautions: Fall (telemetry) Restrictions Weight Bearing Restrictions: No      Mobility  Bed Mobility               General bed mobility comments: up in chair when PT arrived to let nursing observe her  Transfers Overall transfer level: Needs assistance Equipment used: Rolling walker (2 wheeled);1 person hand held assist Transfers: Sit to/from Stand Sit to Stand: Mod assist         General transfer comment: tactile and verbal cues for all  Ambulation/Gait Ambulation/Gait assistance: Mod assist;+2 physical assistance;+2 safety/equipment Ambulation Distance (Feet): 8 Feet (5+3) Assistive device: Rolling walker (2 wheeled);1 person hand held assist Gait Pattern/deviations: Step-to pattern;Step-through pattern;Decreased stride length;Trunk flexed;Wide base of support;Drifts right/left;Ataxic;Shuffle Gait velocity: reduced Gait velocity interpretation: Below normal speed for age/gender General Gait Details: pt very uncoordinated  Financial trader Rank[employer] (Stroke Patients Only)       Balance Overall balance assessment: Needs assistance Sitting-balance support: Feet  supported;Bilateral upper extremity supported Sitting balance-Leahy Scale: Poor     Standing balance support: Bilateral upper extremity supported Standing balance-Leahy Scale: Poor                               Pertinent Vitals/Pain Pain Assessment: No/denies pain    Home Living Family/patient expects to be discharged to:: Skilled nursing facility                      Prior Function Level of Independence: Independent with assistive device(s) (no information available but past hx reported here)         Comments: Gait with cane before previous admission and now more debilitated     Hand Dominance   Dominant Hand: Right    Extremity/Trunk Assessment   Upper Extremity Assessment Upper Extremity Assessment: Generalized weakness    Lower Extremity Assessment Lower Extremity Assessment: Generalized weakness    Cervical / Trunk Assessment Cervical / Trunk Assessment: Kyphotic  Communication   Communication: Expressive difficulties  Cognition Arousal/Alertness: Lethargic Behavior During Therapy: Flat affect;Impulsive Overall Cognitive Status: History of cognitive impairments - at baseline                      General Comments General comments (skin integrity, edema, etc.): Balance appearance may not be typical due to PLOF previously being [facility] for gait, but is now distracted and weak    Exercises     Assessment/Plan    PT Assessment Patient needs continued  PT services  PT Problem List Decreased strength;Decreased range of motion;Decreased activity tolerance;Decreased balance;Decreased mobility;Decreased coordination;Decreased cognition;Decreased safety awareness;Decreased knowledge of use of DME;Decreased knowledge of precautions;Cardiopulmonary status limiting activity          PT Treatment Interventions DME instruction;Gait training;Functional mobility training;Therapeutic activities;Therapeutic exercise;Balance training;Neuromuscular  re-education;Patient/family education    PT Goals (Current goals can be found in the Care Plan section)  Acute Rehab PT Goals Patient Stated Goal: nothing stated PT Goal Formulation: Patient unable to participate in goal setting Time For Goal Achievement: 10/11/16 Potential to Achieve Goals: Good    Frequency Min 2X/week   Barriers to discharge Inaccessible home environment;Decreased caregiver support home with one caregiver and needs 2 person assist    Co-evaluation               End of Session Equipment Utilized During Treatment: Gait belt Activity Tolerance: Patient limited by lethargy;Other (comment) (Pt is slow to progress gait but paritally from wearing brief) Patient left: in chair;with call bell/phone within reach;with chair alarm set;with nursing/sitter in room Nurse Communication: Mobility status         Time: 1329-1401 PT Time Calculation (min) (ACUTE ONLY): 32 min   Charges:   PT Evaluation $PT Eval Moderate Complexity: 1 Procedure PT Treatments $Gait Training: 8-22 mins   PT G Codes:        Ramond Dial 10/26/2016, 4:05 PM   Mee Hives, PT MS Acute Rehab Dept. Number: [place]

## 2016-09-27 NOTE — Progress Notes (Addendum)
Patient admitted with FTT/UTI to room 125. Pt is alert to self and place, confused to time and situation. Pt denied pain on arrival but later stated that she had the worst headache ever, PRN Tylenol given. Pt is extremely weak, requiring 2 assist up to [facility]. Pt has made several attempts to get OOB. Pt reoriented and instructed to stay in bed for her safety, verbalized understanding but continues to make attempts to get OOB and states that she is "going home". Bed alarm activated.

## 2016-09-27 NOTE — Progress Notes (Signed)
Pharmacy Antibiotic Note  ARIANIE COUSE is a 81 y.o. female admitted on 09/26/2016 with UTI.  Pharmacy has been consulted for ceftriaxone dosing.  Plan: Ceftriaxone 1 gm IV Q24H  Height: '5\' 4"'$  (162.6 cm) Weight: 83 lb 9.6 oz (37.9 kg) IBW/kg (Calculated) : 54.7  Temp (24hrs), Avg:97.9 F (36.6 C), Min:97.8 F (36.6 C), Max:97.9 F (36.6 C)   Recent Labs Lab 09/26/16 1550  WBC 11.0  CREATININE 0.79    Estimated Creatinine Clearance: 34.7 mL/min (by C-G formula based on SCr of 0.79 mg/dL).    Allergies  Allergen Reactions  . Ezetimibe Other (See Comments)    Severe myalgias  . Nsaids Hives  . Sulfa Antibiotics Other (See Comments)    Caused hot flashes  . Codeine     Codeine derivatives  . Donepezil Other (See Comments) and Nausea And Vomiting    Caused her to be "deathly sick"- vomiting   Thank you for allowing pharmacy to be a part of this patient's care.  Laural Benes, Pharm.D., BCPS Clinical Pharmacist 09/27/2016 1:13 AM

## 2016-09-27 NOTE — Clinical Social Work Note (Signed)
CSW received consult for SNF placement. CSW will follow pending facility needs.  Santiago Bumpers, MSW, LCSW-A [phone]

## 2016-09-28 ENCOUNTER — Inpatient Hospital Stay: Payer: Medicare Other

## 2016-09-28 DIAGNOSIS — Z7189 Other specified counseling: Secondary | ICD-10-CM

## 2016-09-28 DIAGNOSIS — J90 Pleural effusion, not elsewhere classified: Secondary | ICD-10-CM

## 2016-09-28 DIAGNOSIS — Z515 Encounter for palliative care: Secondary | ICD-10-CM

## 2016-09-28 LAB — PROTEIN, BODY FLUID: Total protein, fluid: 5.5 g/dL

## 2016-09-28 LAB — BODY FLUID CELL COUNT WITH DIFFERENTIAL
EOS FL: 0 %
LYMPHS FL: 89 %
Monocyte-Macrophage-Serous Fluid: 1 %
NEUTROPHIL FLUID: 10 %
Other Cells, Fluid: 0 %
WBC FLUID: 4547 uL

## 2016-09-28 LAB — LACTATE DEHYDROGENASE, PLEURAL OR PERITONEAL FLUID: LD FL: 430 U/L — AB (ref 3–23)

## 2016-09-28 MED ORDER — POLYETHYLENE GLYCOL 3350 17 G PO PACK
17.0000 g | PACK | Freq: Every day | ORAL | Status: DC
  Administered 2016-09-28 – 2016-09-29 (×2): 17 g via ORAL
  Filled 2016-09-28 (×2): qty 1

## 2016-09-28 MED ORDER — POTASSIUM CHLORIDE CRYS ER 20 MEQ PO TBCR
40.0000 meq | EXTENDED_RELEASE_TABLET | Freq: Once | ORAL | Status: AC
  Administered 2016-09-28: 40 meq via ORAL
  Filled 2016-09-28: qty 2

## 2016-09-28 MED ORDER — ORAL CARE MOUTH RINSE
15.0000 mL | Freq: Two times a day (BID) | OROMUCOSAL | Status: DC
  Administered 2016-09-28 – 2016-09-29 (×3): 15 mL via OROMUCOSAL

## 2016-09-28 NOTE — Progress Notes (Signed)
Patient ID: Encarnacion Slates, female   DOB: 1935-12-21, 81 y.o.   MRN: [id number]  [facility] PROGRESS NOTE  SHARONNA VINJE CWC:[id number] DOB: Jan 26, 1936 DOA: 09/26/2016 PCP: Dion Body, MD  HPI/Subjective: Patient sleepy (didn't sleep last night) son at bedside and daughter on phone  Objective: Vitals:   09/28/16 1056 09/28/16 1124  BP: (!) 165/106 (!) 150/83  Pulse: (!) 105 93  Resp: 20 20  Temp:      Filed Weights   09/26/16 1539 09/27/16 0059  Weight: 46.3 kg (102 lb) 37.9 kg (83 lb 9.6 oz)    ROS: Review of Systems  Unable to perform ROS: Acuity of condition   Exam: Physical Exam  HENT:  Nose: No mucosal edema.  Mouth/Throat: No oropharyngeal exudate or posterior oropharyngeal edema.  Eyes: Conjunctivae and lids are normal. Pupils are equal, round, and reactive to light.  Neck: No JVD present. Carotid bruit is not present. No edema present. No thyroid mass and no thyromegaly present.  Cardiovascular: Regular rhythm, S1 normal and S2 normal.  Tachycardia present.  Exam reveals no gallop.   No murmur heard. Pulses:      Dorsalis pedis pulses are 2+ on the right side, and 2+ on the left side.  Respiratory: No respiratory distress. She has no wheezes. She has no rhonchi. She has no rales.  GI: Soft. Bowel sounds are normal. There is no tenderness.  Musculoskeletal:       Right ankle: She exhibits no swelling.       Left ankle: She exhibits no swelling.  Lymphadenopathy:    She has no cervical adenopathy.  Neurological: She is alert.  Able to move all of her extremities on her own.  Skin: Skin is warm. No rash noted. Nails show no clubbing.  Psychiatric: Her affect is blunt.  Sleepy today    Data Reviewed: Basic Metabolic Panel:  Recent Labs Lab 09/26/16 1550 09/27/16 0532  NA 135 138  K 3.2* 3.3*  CL 102 105  CO2 24 26  GLUCOSE 92 109*  BUN 11 10  CREATININE 0.79 0.63  CALCIUM 12.0* 11.7*   Liver Function Tests:  Recent Labs Lab  09/26/16 1550  AST 17  ALT 10*  ALKPHOS 104  BILITOT 0.5  PROT 8.2*  ALBUMIN 3.4*   CBC:  Recent Labs Lab 09/26/16 1550 09/27/16 0532  WBC 11.0 10.6  HGB 11.3* 10.8*  HCT 34.2* 31.7*  MCV 78.6* 78.2*  PLT 630* 654*   Cardiac Enzymes:  Recent Labs Lab 09/26/16 1550  TROPONINI <0.03     Studies: Dg Chest 1 View  Result Date: 09/28/2016 CLINICAL DATA:  Status post thoracentesis on the left. EXAM: CHEST 1 VIEW COMPARISON:  Chest x-ray of September 26, 2016. FINDINGS: The pleural space on the left remains nearly totally opacified. Shift of the mediastinum from right to left has occurred. The right lung is clear. No pneumothorax is evident. The cardiac borders are obscured. The pulmonary vascularity is not engorged. IMPRESSION: Persistent opacification of the left hemithorax with shift of the mediastinum from right to left likely reflecting a central obstructing lesion on the left with resultant atelectasis. No postprocedure complication is observed. Electronically Signed   By: David  Martinique M.D.   On: 09/28/2016 11:43   Ct Head Wo Contrast  Result Date: 09/26/2016 CLINICAL DATA:  Confusion EXAM: CT HEAD WITHOUT CONTRAST TECHNIQUE: Contiguous axial images were obtained from the base of the skull through the vertex without intravenous contrast. COMPARISON:  August 16, 2016 FINDINGS: Brain: There is mild diffuse atrophy, stable. There is no intracranial mass, hemorrhage, extra-axial fluid collection, or midline shift. There is patchy small vessel disease in the centra semiovale bilaterally. Elsewhere gray-white compartments are normal. No acute infarct evident. Vascular: There is no hyperdense vessel. There is calcification in each carotid siphon region. Skull: The bony calvarium appears intact. Sinuses/Orbits: Visualized paranasal sinuses are clear. Orbits appear symmetric bilaterally. Other: Mastoid air cells are clear. IMPRESSION: Atrophy with patchy periventricular small vessel disease.  No intracranial mass, hemorrhage, or extra-axial fluid collection. No acute infarct. Mild vascular calcification. Electronically Signed   By: Lowella Grip III M.D.   On: 09/26/2016 21:08   Dg Chest Portable 1 View  Result Date: 09/26/2016 CLINICAL DATA:  81 year old with left upper lung mass (no diagnosis as of yet) and left pleural effusion, presenting with acute mental status changes which began earlier today. Former smoker. EXAM: PORTABLE CHEST 1 VIEW COMPARISON:  PET-CT 09/01/2016. Chest x-rays 08/17/2016, 08/16/2016. CT chest 08/16/2016. FINDINGS: Large left upper lobe lung mass and associated large left pleural effusion, not changed since the PET-CT 09/01/2016. There is volume loss in the left lung as the mediastinum is shifted to the left. Right lung remains clear. No right pleural effusion. IMPRESSION: Large left upper lobe lung mass and associated large left pleural effusion, stable since the PET-CT of 09/01/2016. No new/acute cardiopulmonary disease. Electronically Signed   By: Evangeline Dakin M.D.   On: 09/26/2016 19:41   US Thoracentesis Asp Pleural Space W/img Guide  Result Date: 09/28/2016 CLINICAL DATA:  Known large left lung mass with component of left pleural effusion. EXAM: ULTRASOUND GUIDED LEFT THORACENTESIS COMPARISON:  Chest x-ray on 09/26/2016 PROCEDURE: An ultrasound guided thoracentesis was thoroughly discussed with the patient's daughter and questions answered. The benefits, risks, alternatives and complications were also discussed. The patient's daughter understands and wishes to proceed with the procedure. Telephone consent was obtained. Ultrasound was performed to localize and mark an adequate pocket of fluid in the left chest. The area was then prepped and draped in the normal sterile fashion. 1% Lidocaine was used for local anesthesia. Under ultrasound guidance a 6 [place] Safe-T-Centesis was introduced. Thoracentesis was performed. The catheter was removed and a dressing  applied. FINDINGS: A total of approximately 325 mL of blood tinged fluid was removed. The entire fluid sample was sent for laboratory analysis. IMPRESSION: Successful ultrasound guided left thoracentesis yielding 325 mL of pleural fluid. Electronically Signed   By: Aletta Edouard M.D.   On: 09/28/2016 11:10    Scheduled Meds: . calcitonin (salmon)  1 spray Alternating Nares Daily  . cefTRIAXone  1 g Intravenous Q24H  . feeding supplement (ENSURE ENLIVE)  237 mL Oral BID BM  . levothyroxine  50 mcg Oral QAC breakfast  . mouth rinse  15 mL Mouth Rinse BID  . mirtazapine  15 mg Oral QHS  . pantoprazole  40 mg Oral BID AC  . polyethylene glycol  17 g Oral Daily   Continuous Infusions: . sodium chloride 75 mL/hr at 09/28/16 1828    Assessment/Plan:  1. Hypercalcemia of malignancy. Dose of Zometa given. Continue IV fluids. 2. Acute cystitis with hematuria with acute encephalopathy continue Rocephin. urine culture no growth thus far 3. Large lung mass: not amenable for IR guided biopsy, Pulmo feels it's high risk bronch as well. So await thora fluid c/s.MRI of the brain to rule out brain metastases. 4. Failure to thrive and severe malnutrition on Megace. Encourage eating. 5. Weakness  physical therapy consultation. Family prefers to take her home 6. ACP discussion and family choosing Home with Hospice 7. Hypothyroidism unspecified on levothyroxine  Code Status:     Code Status Orders        Start     Ordered   09/27/16 1000  Do not attempt resuscitation (DNR)  Continuous    Question Answer Comment  In the event of cardiac or respiratory ARREST Do not call a "code blue"   In the event of cardiac or respiratory ARREST Do not perform Intubation, CPR, defibrillation or ACLS   In the event of cardiac or respiratory ARREST Use medication by any route, position, wound care, and other measures to relive pain and suffering. May use oxygen, suction and manual treatment of airway obstruction as  needed for comfort.   Comments nurse to pronounce      09/27/16 1000    Code Status History    Date Active Date Inactive Code Status Order ID Comments User Context   09/27/2016  1:06 AM 09/27/2016 10:00 AM Full Code [id number]  Harvie Bridge, DO Inpatient   08/16/2016  8:37 PM 08/18/2016  7:23 PM Full Code [id number]  Nicholes Mango, MD Inpatient     Family Communication: Case discussed at length with daughter on phone and son at bedside Disposition Plan: Home with Hospice   Consultants:  Pulmonary   Antibiotics:  Rocephin  Time spent: 28 minutes  [place]

## 2016-09-28 NOTE — NC FL2 (Signed)
[place] LEVEL OF CARE SCREENING TOOL     IDENTIFICATION  Patient Name: Gabriela Jordan Birthdate: [birth year] Sex: female Admission Date (Current Location): 09/26/2016  [place] and [place] Number:  Engineering geologist and Address:  [facility], [address] [place], [place] [address]      Provider Number: [id number]  Attending Physician Name and Address:  Max Sane, MD  Relative Name and Phone Number:       Current Level of Care: Hospital Recommended Level of Care: [place] Prior Approval Number:    Date Approved/Denied:   PASRR Number:  [id number])  Discharge Plan: SNF    Current Diagnoses: Patient Active Problem List   Diagnosis Date Noted  . Mass of left lung 08/18/2016  . S/P thoracentesis 08/18/2016  . Failure to thrive in adult 08/18/2016  . Hyponatremia 08/18/2016  . Dehydration 08/18/2016  . Thrombocytosis ([place]) 08/18/2016  . Anemia 08/18/2016  . Hyperglycemia 08/18/2016  . Hypercalcemia 08/18/2016  . Pleural effusion 08/16/2016    Orientation RESPIRATION BLADDER Height & Weight     Self  Normal Incontinent Weight: 83 lb 9.6 oz (37.9 kg) Height:  '5\' 4"'$  (162.6 cm)  BEHAVIORAL SYMPTOMS/MOOD NEUROLOGICAL BOWEL NUTRITION STATUS   (none)  (none) Continent Diet (Diet: Heart Healthy )  AMBULATORY STATUS COMMUNICATION OF NEEDS Skin   Extensive Assist Verbally Normal                       Personal Care Assistance Level of Assistance  Bathing, Feeding, Dressing Bathing Assistance: Limited assistance Feeding assistance: Independent Dressing Assistance: Limited assistance     Functional Limitations Info  Sight, Hearing, Speech Sight Info: Adequate Hearing Info: Adequate Speech Info: Adequate    SPECIAL CARE FACTORS FREQUENCY  PT (By licensed PT), OT (By licensed OT)     PT Frequency:  (5) OT Frequency:  (5)            Contractures      Additional Factors Info  Code Status,  Allergies Code Status Info:  (DNR ) Allergies Info:  (Ezetimibe, Nsaids, Sulfa Antibiotics, Codeine, Donepezil)           Current Medications (09/28/2016):  This is the current hospital active medication list Current Facility-Administered Medications  Medication Dose Route Frequency Provider Last Rate Last Dose  . 0.9 %  sodium chloride infusion   Intravenous Continuous Alexis Hugelmeyer, DO 75 mL/hr at 09/28/16 0419    . acetaminophen (TYLENOL) tablet 650 mg  650 mg Oral Q6H PRN Alexis Hugelmeyer, DO   650 mg at 09/27/16 0208   Or  . acetaminophen (TYLENOL) suppository 650 mg  650 mg Rectal Q6H PRN Alexis Hugelmeyer, DO      . albuterol (PROVENTIL) (2.5 MG/3ML) 0.083% nebulizer solution 2.5 mg  2.5 mg Nebulization Q6H PRN Alexis Hugelmeyer, DO      . bisacodyl (DULCOLAX) EC tablet 5 mg  5 mg Oral Daily PRN Alexis Hugelmeyer, DO      . calcitonin (salmon) (MIACALCIN/FORTICAL) nasal spray 1 spray  1 spray Alternating Nares Daily Alexis Hugelmeyer, DO   1 spray at 09/27/16 0947  . cefTRIAXone (ROCEPHIN) IVPB 1 g  1 g Intravenous Q24H Alexis Hugelmeyer, DO   1 g at 09/27/16 1847  . feeding supplement (ENSURE ENLIVE) (ENSURE ENLIVE) liquid 237 mL  237 mL Oral BID BM Loletha Grayer, MD      . HYDROcodone-acetaminophen (NORCO/VICODIN) 5-325 MG per tablet 1 tablet  1  tablet Oral Q6H PRN Alexis Hugelmeyer, DO   1 tablet at 09/27/16 2014  . levothyroxine (SYNTHROID, LEVOTHROID) tablet 50 mcg  50 mcg Oral QAC breakfast Alexis Hugelmeyer, DO   50 mcg at 09/28/16 0809  . magnesium citrate solution 1 Bottle  1 Bottle Oral Once PRN Alexis Hugelmeyer, DO      . megestrol (MEGACE) tablet 40 mg  40 mg Oral Daily Alexis Hugelmeyer, DO   40 mg at 09/27/16 0946  . mirtazapine (REMERON) tablet 15 mg  15 mg Oral QHS Alexis Hugelmeyer, DO   15 mg at 09/27/16 2014  . ondansetron (ZOFRAN) tablet 4 mg  4 mg Oral Q6H PRN Alexis Hugelmeyer, DO       Or  . ondansetron (ZOFRAN) injection 4 mg  4 mg Intravenous Q6H PRN  Alexis Hugelmeyer, DO      . pantoprazole (PROTONIX) EC tablet 40 mg  40 mg Oral BID AC Alexis Hugelmeyer, DO   40 mg at 09/28/16 0809  . prochlorperazine (COMPAZINE) tablet 10 mg  10 mg Oral Q6H PRN Alexis Hugelmeyer, DO      . senna-docusate (Senokot-S) tablet 1 tablet  1 tablet Oral QHS PRN Alexis Hugelmeyer, DO   1 tablet at 09/27/16 2014  . traMADol (ULTRAM) tablet 50 mg  50 mg Oral Q6H PRN Alexis Hugelmeyer, DO         Discharge Medications: Please see discharge summary for a list of discharge medications.  Relevant Imaging Results:  Relevant Lab Results:   Additional Information  (SSN: [id number])  [employer]ker Veasey, Veronia Beets, LCSW

## 2016-09-28 NOTE — Plan of Care (Signed)
Problem: Education: Goal: Knowledge of [facility] Education information/materials will improve Outcome: Not Progressing Pt confused. Alert to self. Withdrawn. Sleeping between care.  Problem: Nutrition: Goal: Adequate nutrition will be maintained Outcome: Not Progressing Poor appetite. Ensure given. Pt had only few sips of the Ensure. IVF infusing.

## 2016-09-28 NOTE — Progress Notes (Signed)
Family Meeting Note  Advance Directive:yes  Today a meeting took place with the Patient and Son at bedside and daughter on phone.  Patient is unable to participate due [id number] capacity Sedated/AMS   The following clinical team members were present during this meeting:MD  The following were discussed:Patient's diagnosis: , Patient's progosis: < 6 weeks and Goals for treatment: DNR  Additional follow-up to be provided: Palliative care c/s, plan is to take her Home with [facility] per daughter  Time spent during discussion:20 minutes  Max Sane, MD

## 2016-09-28 NOTE — Progress Notes (Signed)
Per RN case manager plan is for patient to D/C home with hospice. Please reconsult if future social work needs arise. CSW signing off.   [employer], LCSW [phone]

## 2016-09-28 NOTE — Consult Note (Signed)
Consultation Note Date: 09/28/2016   Patient Name: Gabriela Jordan  DOB: 12-03-1935  MRN: [id number]  Age / Sex: 81 y.o., female  PCP: Dion Body, MD Referring Physician: Max Sane, MD  Reason for Consultation: Establishing goals of care  HPI/Patient Profile: 81 y.o. female  with past medical history of hypothyroidism and recent diagnosis of lung cancer admitted on 09/26/2016 with lethargy and AMS. Large left lung mass/pleural effusion recently discovered in ED visit in November. Per Dr. Grayland Ormond on 08/25/16, although thoracentesis was negative for malignancy, mass highly suspicious for stage IV lung cancer with malignant pleural effusion. CT head negative for metastatic disease. PET scan completed. Per daughter, patient has had increased weakness, lethargy, weight loss, and decreased PO intake. Evaluated by pulmonology-patient at high risk of complications with bronchoscopy given severe weakness and dementia. Palliative medicine consultation for goals of care/hospice discussion.   Clinical Assessment and Goals of Care: I have reviewed medical record, received report from Dr. Manuella Ghazi, assessed the patient and then spoke with daughter, [name] to discuss diagnosis, prognosis, GOC, EOL wishes, and disposition.  I introduced Palliative Medicine as specialized medical care for people living with serious illness. It focuses on providing relief from the symptoms and stress of a serious illness. The goal is to improve quality of life for both the patient and the family.  [name] and I discussed a brief life review of the patient. She lives at home with husband and daughter (caregiver). She has been married for 54 years and has seven children, who are all very supportive. 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 Her family and little dog, Claybon Jabs, are most important to her.   Discussed new diagnosis of lung cancer and  her decline in functional and nutritional status the past three months. Discussed the difference between aggressive medical interventions and a comfort care pathway. Daughter, [name] is an Therapist, sports and has a good understanding that her mother is "not doing well" and "appropriate for hospice." She does not want her to suffer and is honoring her wishes of "allowing her to die at home with her husband and dog."   [name] is familiar with hospice services because her father currently has [facility] at home. She praises the care team and would like for services to be provided for her mother also. Discussed how focus will be on comfort, quality, and dignity for as much time as her mother may have left. She agrees with this. Discussed symptom management and expectations at EOL.   [name] is tearful during the conversation, stating "I can't save my mother." She understands comfort is the focus and accepts hospice services. Questions and concerns were addressed. [name] has my contact information.    SUMMARY OF RECOMMENDATIONS    DNR/DNI  Daughter and family agreeable with hospice services at home. Case management aware. Most likely d/c tomorrow, 09/29/16.  PMT will shadow chart and make recommendations as needed.   Code Status/Advance Care Planning:  DNR   Symptom Management:   Per attending  Palliative  Prophylaxis:   Aspiration, Delirium Protocol, Oral Care and Turn Reposition  Psycho-social/Spiritual:   Desire for further Chaplaincy support:no  Additional Recommendations: Caregiving  Support/Resources and Education on Hospice  Prognosis:   < 6 weeks if not significantly less with increased lethargy/confusion and severe functional and nutritional status decline in the setting of lung cancer with possible mets.   Discharge Planning: Home with Hospice      Primary Diagnoses: Present on Admission: . Failure to thrive in adult   I have reviewed the medical record, interviewed the patient  and family, and examined the patient. The following aspects are pertinent.  Past Medical History:  Diagnosis Date  . Hypothyroid    Social History   Social History  . Marital status: Married    Spouse name: N/A  . Number of children: N/A  . Years of education: N/A   Social History Main Topics  . Smoking status: Former Research scientist (life sciences)  . Smokeless tobacco: Never Used  . Alcohol use No  . Drug use: No  . Sexual activity: Not Asked   Other Topics Concern  . None   Social History Narrative  . None   Family History  Problem Relation Age of Onset  . Breast cancer Daughter   . Bone cancer Daughter    Scheduled Meds: . calcitonin (salmon)  1 spray Alternating Nares Daily  . cefTRIAXone  1 g Intravenous Q24H  . feeding supplement (ENSURE ENLIVE)  237 mL Oral BID BM  . levothyroxine  50 mcg Oral QAC breakfast  . mouth rinse  15 mL Mouth Rinse BID  . mirtazapine  15 mg Oral QHS  . pantoprazole  40 mg Oral BID AC  . polyethylene glycol  17 g Oral Daily   Continuous Infusions: . sodium chloride 75 mL/hr at 09/28/16 0419   PRN Meds:.acetaminophen **OR** acetaminophen, albuterol, bisacodyl, magnesium citrate, ondansetron **OR** ondansetron (ZOFRAN) IV, prochlorperazine, senna-docusate Medications Prior to Admission:  Prior to Admission medications   Medication Sig Start Date End Date Taking? Authorizing Provider  acetaminophen (TYLENOL) 500 MG tablet Take 500 mg by mouth every 6 (six) hours as needed.   Yes Historical Provider, MD  aspirin EC 81 MG tablet 81 mg.   Yes Historical Provider, MD  calcitonin, salmon, (MIACALCIN/FORTICAL) 200 UNIT/ACT nasal spray Place 1 spray into alternate nostrils daily. 08/19/16  Yes Theodoro Grist, MD  HYDROcodone-acetaminophen (NORCO/VICODIN) 5-325 MG tablet Take 1 tablet by mouth every 6 (six) hours as needed for moderate pain. 09/23/16  Yes Cammie Sickle, MD  levothyroxine (SYNTHROID, LEVOTHROID) 50 MCG tablet Take 50 mcg by mouth daily before  breakfast.   Yes Historical Provider, MD  megestrol (MEGACE) 40 MG tablet Take 1 tablet (40 mg total) by mouth daily. 08/28/16  Yes Lloyd Huger, MD  pantoprazole (PROTONIX) 40 MG tablet Take 1 tablet (40 mg total) by mouth 2 (two) times daily. 08/18/16  Yes Theodoro Grist, MD  prochlorperazine (COMPAZINE) 10 MG tablet Take 1 tablet (10 mg total) by mouth every 6 (six) hours as needed for nausea or vomiting. 09/23/16  Yes Cammie Sickle, MD  Red Yeast Rice 600 MG TABS Take by mouth.   Yes Historical Provider, MD  senna-docusate (SENOKOT-S) 8.6-50 MG tablet Take 1 tablet by mouth at bedtime as needed for mild constipation. 08/18/16  Yes Theodoro Grist, MD  traMADol (ULTRAM) 50 MG tablet Take 1 tablet (50 mg total) by mouth every 6 (six) hours as needed for moderate pain. 08/18/16  Yes Theodoro Grist, MD  Allergies  Allergen Reactions  . Ezetimibe Other (See Comments)    Severe myalgias  . Nsaids Hives  . Sulfa Antibiotics Other (See Comments)    Caused hot flashes  . Codeine     Codeine derivatives  . Donepezil Other (See Comments) and Nausea And Vomiting    Caused her to be "deathly sick"- vomiting   Review of Systems  Unable to perform ROS: Acuity of condition    Physical Exam  Constitutional: She appears lethargic. She appears ill.  Cardiovascular: Regular rhythm.   Pulmonary/Chest: Effort normal. She has decreased breath sounds.  Abdominal: Normal appearance.  Neurological: She appears lethargic.  Skin: Skin is warm and dry. There is pallor.  Psychiatric: Cognition and memory are impaired. She is inattentive.  Nursing note and vitals reviewed.  Vital Signs: BP (!) 150/83 (BP Location: Left Arm)   Pulse 93   Temp 98 F (36.7 C) (Oral)   Resp 20   Ht '5\' 4"'$  (1.626 m)   Wt 37.9 kg (83 lb 9.6 oz)   SpO2 97%   BMI 14.35 kg/m  Pain Assessment: PAINAD   Pain Score: 0-No pain   SpO2: SpO2: 97 % O2 Device:SpO2: 97 % O2 Flow Rate: .   IO: Intake/output summary:    Intake/Output Summary (Last 24 hours) at 09/28/16 1544 Last data filed at 09/28/16 0945  Gross per 24 hour  Intake          2339.75 ml  Output                0 ml  Net          2339.75 ml    LBM: Last BM Date:  (Last BM unknown) Baseline Weight: Weight: 46.3 kg (102 lb) Most recent weight: Weight: 37.9 kg (83 lb 9.6 oz)     Palliative Assessment/Data: PPS 20%   Flowsheet Rows   Flowsheet Row Most Recent Value  Intake Tab  Referral Department  Hospitalist  Unit at Time of Referral  Oncology Unit  Palliative Care Primary Diagnosis  Cancer  Date Notified  09/28/16  Palliative Care Type  New Palliative care  Reason for referral  Clarify Goals of Care, Counsel Regarding Hospice  Date of Admission  09/26/16  # of days IP prior to Palliative referral  2  Clinical Assessment  Palliative Performance Scale Score  20%  Psychosocial & Spiritual Assessment  Palliative Care Outcomes  Patient/Family meeting held?  Yes  Who was at the meeting?  daughter  Palliative Care Outcomes  Clarified goals of care, Counseled regarding hospice, Provided end of life care assistance, Provided psychosocial or spiritual support, Transitioned to hospice      Time In: 1400 Time Out: 1510 Time Total: 53mn Greater than 50%  of this time was spent counseling and coordinating care related to the above assessment and plan.  Signed by:  MIhor Dow FNP-C Palliative Medicine Team  Phone: [phone]: [phone]  Please contact Palliative Medicine Team phone at [phone] questions and concerns.  For individual provider: See AShea Evans

## 2016-09-28 NOTE — Care Management Important Message (Signed)
Important Message  Patient Details  Name: Gabriela Jordan MRN: [id number] Date of Birth: Jul 24, 1936   Medicare Important Message Given:  Yes    Shelbie Ammons, RN 09/28/2016, 11:47 AM

## 2016-09-29 ENCOUNTER — Ambulatory Visit: Payer: Medicare Other | Admitting: Internal Medicine

## 2016-09-29 ENCOUNTER — Telehealth: Payer: Self-pay | Admitting: *Deleted

## 2016-09-29 LAB — TRIGLYCERIDES, BODY FLUIDS: TRIGLYCERIDES FL: 33 mg/dL

## 2016-09-29 LAB — URINE CULTURE: Culture: <10000 — AB

## 2016-09-29 MED ORDER — ENSURE ENLIVE PO LIQD: 237.0000 mL | Freq: Two times a day (BID) | ORAL | 12 refills | Status: AC

## 2016-09-29 NOTE — Telephone Encounter (Signed)
Will cancel appt per DR.

## 2016-09-29 NOTE — Significant Event (Signed)
At Dr Trena Platt request, I spoke with pt's daughter, Gabriela Jordan (over the phone as she was not in the room when I went by). I reviewed the pt's records and database. She has a very large, cavitating LUL mass with digital clubbing and hypercalcemia. This is undoubtedly squamous cell carcinoma. Gabriela Jordan is quite frail and would not be a candidate for any specific therapy for cancer. A bronchoscopy would be very high risk and whether or not it confirmed what we highly suspect (already know?), it would not change the overall outcome for the better.  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 The plan is for pt to go home with Hospice and not pursue any further diagnostic tests. I support this plan.  Please call if we can be of any further assistance  Merton Border, MD PCCM service Mobile [phone] Pager [phone] 09/29/2016

## 2016-09-29 NOTE — Discharge Instructions (Addendum)
° °  Failure to Thrive, Adult Introduction Failure to thrive is a group of problems. These problems include eating too little and losing weight. People who have this condition may do fewer and fewer activities over time. They may lose interest in being with friends or they may not want to eat or drink. Follow these instructions at home:  Take medicines only as told by your doctor.  Eat a healthy, well-balanced diet. Make sure that you eat enough.  Be active. Do strength training. A physical therapist can help to set up an exercise program that fits you.  Make sure that you are safe at home.  Make sure that you have a plan for what to do if you cannot make decisions for yourself. Contact a doctor if:  You are not able to eat well.  You are not able to move around.  You feel very sad.  You feel very hopeless. Get help right away if:  You think about ending your life.  You cannot eat or drink.  You do not get out of bed.  Staying at home is not safe.  You have a fever. This information is not intended to replace advice given to you by your health care provider. Make sure you discuss any questions you have with your health care provider. Document Released: 08/27/2011 Document Revised: 02/13/2016 Document Reviewed: 12/03/2014  2017 Elsevier Failure to Thrive, Adult Introduction Failure to thrive is a group of problems. These problems include eating too little and losing weight. People who have this condition may do fewer and fewer activities over time. They may lose interest in being with friends or they may not want to eat or drink. Follow these instructions at home:  Take medicines only as told by your doctor.  Eat a healthy, well-balanced diet. Make sure that you eat enough.  Be active. Do strength training. A physical therapist can help to set up an exercise program that fits you.  Make sure that you are safe at home.  Make sure that you have a plan for what to do if  you cannot make decisions for yourself. Contact a doctor if:  You are not able to eat well.  You are not able to move around.  You feel very sad.  You feel very hopeless. Get help right away if:  You think about ending your life.  You cannot eat or drink.  You do not get out of bed.  Staying at home is not safe.  You have a fever. This information is not intended to replace advice given to you by your health care provider. Make sure you discuss any questions you have with your health care provider. Document Released: 08/27/2011 Document Revised: 02/13/2016 Document Reviewed: 12/03/2014  2017 Elsevier

## 2016-09-29 NOTE — Telephone Encounter (Signed)
-----   Message from Laverle Hobby, MD sent at 09/27/2016  4:31 PM EST ----- Pt has outpt appt, but admitted-- seen inpatient.

## 2016-09-29 NOTE — Care Management (Signed)
Telephone call to [place] at [facility].  States that the only information they would need to start care in the home would be a discharge summary. Will fax this information to [phone]. Dr. Manuella Ghazi updated. Shelbie Ammons RN MSN CCM Care Management

## 2016-09-29 NOTE — Discharge Summary (Signed)
Gabriela Jordan at [place] NAME: Gabriela Jordan    MR#:  [id number]  DATE OF BIRTH:  1935-12-08  DATE OF ADMISSION:  09/26/2016   ADMITTING PHYSICIAN: Harvie Bridge, DO  DATE OF DISCHARGE: 09/29/2016  PRIMARY CARE PHYSICIAN: Dion Body, MD   ADMISSION DIAGNOSIS:  Hypokalemia [E87.6] Confusion [R41.0] Weakness [R53.1] Failure to thrive in adult [R62.7] Urinary tract infection, acute [N39.0] DISCHARGE DIAGNOSIS:  Active Problems:   Failure to thrive in adult   Palliative care encounter   Encounter for hospice care discussion  SECONDARY DIAGNOSIS:   Past Medical History:  Diagnosis Date  . Hypothyroid    HOSPITAL COURSE:  Gabriela Jordan is a 81 y.o. female with a known history of hypothyroidism, recently diagnosed lung cancer (presumed based on PET, no tissue dx) admitted for evaluation of lethargy.   1. Hypercalcemia of malignancy. Dose of Zometa given 2. Acute cystitis with hematuria with acute encephalopathy: treated with Abx. urine culture no growth thus far  3. Large lung mass: not amenable for IR guided biopsy, Pulmo feels it's high risk bronch as well. Patient and family chose comfort care and Hospice at home. Patient doesn't want any cancer work up and chose comfort care 4. Failure to thrive and severe malnutrition on Megace. Encourage eating. 5. Weakness physical therapy consultation. Family prefers to take her home with Hospice services 6. Hypothyroidism unspecified on levothyroxine 7. Severe chronic protein calorie malnutrition  DISCHARGE CONDITIONS:  fair CONSULTS OBTAINED:   DRUG ALLERGIES:   Allergies  Allergen Reactions  . Ezetimibe Other (See Comments)    Severe myalgias  . Nsaids Hives  . Sulfa Antibiotics Other (See Comments)    Caused hot flashes  . Codeine     Codeine derivatives  . Donepezil Other (See Comments) and Nausea And Vomiting    Caused her to be "deathly sick"- vomiting   DISCHARGE  MEDICATIONS:   Allergies as of 09/29/2016      Reactions   Ezetimibe Other (See Comments)   Severe myalgias   Nsaids Hives   Sulfa Antibiotics Other (See Comments)   Caused hot flashes   Codeine    Codeine derivatives   Donepezil Other (See Comments), Nausea And Vomiting   Caused her to be "deathly sick"- vomiting      Medication List    TAKE these medications   aspirin EC 81 MG tablet 81 mg.   calcitonin (salmon) 200 UNIT/ACT nasal spray Commonly known as:  MIACALCIN/FORTICAL Place 1 spray into alternate nostrils daily.   feeding supplement (ENSURE ENLIVE) Liqd Take 237 mLs by mouth 2 (two) times daily between meals.   HYDROcodone-acetaminophen 5-325 MG tablet Commonly known as:  NORCO/VICODIN Take 1 tablet by mouth every 6 (six) hours as needed for moderate pain.   levothyroxine 50 MCG tablet Commonly known as:  SYNTHROID, LEVOTHROID Take 50 mcg by mouth daily before breakfast.   megestrol 40 MG tablet Commonly known as:  MEGACE Take 1 tablet (40 mg total) by mouth daily.   pantoprazole 40 MG tablet Commonly known as:  PROTONIX Take 1 tablet (40 mg total) by mouth 2 (two) times daily.   prochlorperazine 10 MG tablet Commonly known as:  COMPAZINE Take 1 tablet (10 mg total) by mouth every 6 (six) hours as needed for nausea or vomiting.   Red Yeast Rice 600 MG Tabs Take by mouth.   senna-docusate 8.6-50 MG tablet Commonly known as:  Senokot-S Take 1 tablet by mouth at bedtime as  needed for mild constipation.   traMADol 50 MG tablet Commonly known as:  ULTRAM Take 1 tablet (50 mg total) by mouth every 6 (six) hours as needed for moderate pain.   TYLENOL 500 MG tablet Generic drug:  acetaminophen Take 500 mg by mouth every 6 (six) hours as needed.      DISCHARGE INSTRUCTIONS:   DIET:  Regular diet DISCHARGE CONDITION:  Fair ACTIVITY:  Activity as tolerated OXYGEN:  Home Oxygen: No.  Oxygen Delivery: room air DISCHARGE LOCATION:  home with  [facility]  If you experience worsening of your admission symptoms, develop shortness of breath, life threatening emergency, suicidal or homicidal thoughts you must seek medical attention immediately by calling 911 or calling your MD immediately  if symptoms less severe.  You Must read complete instructions/literature along with all the possible adverse reactions/side effects for all the Medicines you take and that have been prescribed to you. Take any new Medicines after you have completely understood and accpet all the possible adverse reactions/side effects.   Please note  You were cared for by a hospitalist during your hospital stay. If you have any questions about your discharge medications or the care you received while you were in the hospital after you are discharged, you can call the unit and asked to speak with the hospitalist on call if the hospitalist that took care of you is not available. Once you are discharged, your primary care physician will handle any further medical issues. Please note that NO REFILLS for any discharge medications will be authorized once you are discharged, as it is imperative that you return to your primary care physician (or establish a relationship with a primary care physician if you do not have one) for your aftercare needs so that they can reassess your need for medications and monitor your lab values.    On the day of Discharge:  VITAL SIGNS:  Blood pressure (!) 158/94, pulse (!) 103, temperature 98.2 F (36.8 C), temperature source Oral, resp. rate 16, height '5\' 4"'$  (1.626 m), weight 37.9 kg (83 lb 9.6 oz), SpO2 97 %. PHYSICAL EXAMINATION:  GENERAL:  81 y.o.-year-old patient lying in the bed with no acute distress. Cachetic, malnourished Nose: No mucosal edema.  Mouth/Throat: No oropharyngeal exudate or posterior oropharyngeal edema.  Eyes: Conjunctivae and lids are normal. Pupils are equal, round, and reactive to light.  Neck: No JVD present.  Carotid bruit is not present. No edema present. No thyroid mass and no thyromegaly present.  Cardiovascular: Regular rhythm, S1 normal and S2 normal.  Tachycardia present.  Exam reveals no gallop.   No murmur heard. Pulses:      Dorsalis pedis pulses are 2+ on the right side, and 2+ on the left side.  Respiratory: No respiratory distress. She has no wheezes. She has no rhonchi. She has no rales.  GI: Soft. Bowel sounds are normal. There is no tenderness.  Musculoskeletal:       Right ankle: She exhibits no swelling.       Left ankle: She exhibits no swelling.  Lymphadenopathy:    She has no cervical adenopathy.  Neurological: She is alert.  Able to move all of her extremities on her own.  Skin: Skin is warm. No rash noted. Nails show no clubbing.  Psychiatric: Jordan Wnl DATA REVIEW:   CBC  Recent Labs Lab 09/27/16 0532  WBC 10.6  HGB 10.8*  HCT 31.7*  PLT 654*    Chemistries   Recent Labs Lab 09/26/16  1550 09/27/16 0532  NA 135 138  K 3.2* 3.3*  CL 102 105  CO2 24 26  GLUCOSE 92 109*  BUN 11 10  CREATININE 0.79 0.63  CALCIUM 12.0* 11.7*  AST 17  --   ALT 10*  --   ALKPHOS 104  --   BILITOT 0.5  --     Follow-up Information    Dion Body, MD. Schedule an appointment as soon as possible for a visit in 1 week(s).   Specialty:  Family Medicine Contact information: [address] [place] [address] [phone]           Management plans discussed with the patient, family and they are in agreement.  CODE STATUS: DNR, comfort care, Hospice to follow at home   [place]: 45 minutes.    Max Sane M.D on 09/29/2016 at 11:29 AM  Between 7am to 6pm - Pager - [phone]  After 6pm go to [email or web address] - Proofreader  [facility]  Office  [phone]  CC: Primary care physician; Dion Body, MD   Note: This dictation was prepared with Dragon  dictation along with smaller phrase technology. Any transcriptional errors that result from this process are unintentional.

## 2016-09-29 NOTE — Plan of Care (Signed)
MD making rounds. Received order to discharge home. IV removed. Patient provided with Education Handout. Prescriptions E-Scribed to pharmacy. Discharge paperwork provided, explained, signed and witnessed. No unanswered questions. Discharged via wheelchair by auxiliary staff. Belongings sent with patient and family.

## 2016-09-29 NOTE — Progress Notes (Signed)
Family Meeting Note  Advance Directive:yes  Today a meeting took place with the Patient.  The following clinical team members were present during this meeting:MD  The following were discussed:Patient's diagnosis: , Patient's progosis: < 12 months and Goals for treatment: DNR  Additional follow-up to be provided: comfort care, Hospice at home  Time spent during discussion:20 minutes  Max Sane, MD

## 2016-09-30 LAB — CHOLESTEROL, BODY FLUID: Cholesterol, Fluid: 123 mg/dL

## 2016-09-30 LAB — COMP PANEL: LEUKEMIA/LYMPHOMA

## 2016-10-01 LAB — BODY FLUID CULTURE: Culture: NO GROWTH

## 2016-10-02 LAB — CYTOLOGY - NON PAP

## 2016-10-12 ENCOUNTER — Encounter: Payer: Self-pay | Admitting: Internal Medicine

## 2016-11-19 DEATH — deceased

## 2018-08-06 IMAGING — CT NM PET TUM IMG INITIAL (PI) SKULL BASE T - THIGH
1 of 10 series · 1 of 25 positions shown · non-contrast
Comparison: CT 08/16/2016

CLINICAL DATA: Initial treatment strategy for lung mass.

EXAM:
NUCLEAR MEDICINE PET SKULL BASE TO THIGH
TECHNIQUE: 12.2 mCi F-18 FDG was injected intravenously. Full-ring PET imaging
was performed from the skull base to thigh after the radiotracer. CT
data was obtained and used for attenuation correction and anatomic
localization.
FASTING BLOOD GLUCOSE:  Value: 111 mg/dl

[Series 4: pet wb (ac) · axial · 5.0mm · 4.07mm/px · 1 of 251 slices shown]
[im 126/251]
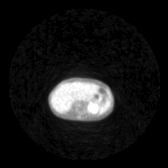

[1 of 25 positions shown; findings below may reference images not displayed]

FINDINGS: NECK

No hypermetabolic lymph nodes in the neck.

CHEST

LEFT upper lobe mass with intense peripheral metabolic activity and
central photopenia is most suggestive of a necrotic malignancy.
Abscess is less favored. The rim of this large lesion is intensely
metabolic with SUV max equal 20. Lesion measures 9.3 x 6.5 cm in
axial dimension not changed 9.0 x 6.5 cm on comparison CT scan.
There is a moderate Left effusion.

No hypermetabolic mediastinal nodes.  No RIGHT lung nodules.

ABDOMEN/PELVIS

No abnormal hypermetabolic activity within the liver, pancreas,
adrenal glands, or spleen. No hypermetabolic lymph nodes in the
abdomen or pelvis.

SKELETON

No focal hypermetabolic activity to suggest skeletal metastasis.
IMPRESSION: 1. Large peripherally hypermetabolic mass in LEFT upper lobe with
central necrosis obstructing the bronchus is most concerning for
BRONCHOGENIC CARCINOMA VERSUS PULMONARY ABSCESS.
2. Small LEFT pleural effusion.
3. No evidence of mediastinal nodal metastasis or distant
metastasis.
